# Patient Record
Sex: Female | Born: 1970 | Race: White | Hispanic: No | Marital: Married | Smoking: Never smoker
Health system: Southern US, Community
[De-identification: ages and names within clinical notes are randomized; demographics above are authoritative.]

## PROBLEM LIST (undated history)

## (undated) DIAGNOSIS — F329 Major depressive disorder, single episode, unspecified: Secondary | ICD-10-CM

## (undated) DIAGNOSIS — F909 Attention-deficit hyperactivity disorder, unspecified type: Secondary | ICD-10-CM

## (undated) DIAGNOSIS — F32A Depression, unspecified: Secondary | ICD-10-CM

## (undated) HISTORY — DX: Major depressive disorder, single episode, unspecified: F32.9

## (undated) HISTORY — DX: Depression, unspecified: F32.A

## (undated) HISTORY — PX: BREAST CYST ASPIRATION: SHX578

## (undated) HISTORY — DX: Attention-deficit hyperactivity disorder, unspecified type: F90.9

## (undated) HISTORY — PX: BREAST BIOPSY: SHX20

---

## 2000-12-29 ENCOUNTER — Other Ambulatory Visit: Admission: RE | Admit: 2000-12-29 | Discharge: 2000-12-29 | Payer: Self-pay | Admitting: Gynecology

## 2002-01-07 ENCOUNTER — Other Ambulatory Visit: Admission: RE | Admit: 2002-01-07 | Discharge: 2002-01-07 | Payer: Self-pay | Admitting: Gynecology

## 2002-06-13 ENCOUNTER — Other Ambulatory Visit: Admission: RE | Admit: 2002-06-13 | Discharge: 2002-06-13 | Payer: Self-pay | Admitting: Gynecology

## 2002-10-09 ENCOUNTER — Other Ambulatory Visit: Admission: RE | Admit: 2002-10-09 | Discharge: 2002-10-09 | Payer: Self-pay | Admitting: Gynecology

## 2003-04-02 ENCOUNTER — Other Ambulatory Visit: Admission: RE | Admit: 2003-04-02 | Discharge: 2003-04-02 | Payer: Self-pay | Admitting: Gynecology

## 2004-05-05 ENCOUNTER — Other Ambulatory Visit: Admission: RE | Admit: 2004-05-05 | Discharge: 2004-05-05 | Payer: Self-pay | Admitting: Gynecology

## 2005-01-30 ENCOUNTER — Inpatient Hospital Stay (HOSPITAL_COMMUNITY): Admission: AD | Admit: 2005-01-30 | Discharge: 2005-02-02 | Payer: Self-pay | Admitting: Obstetrics and Gynecology

## 2005-02-14 HISTORY — PX: THYROIDECTOMY: SHX17

## 2005-05-30 ENCOUNTER — Other Ambulatory Visit: Admission: RE | Admit: 2005-05-30 | Discharge: 2005-05-30 | Payer: Self-pay | Admitting: Gynecology

## 2006-08-02 ENCOUNTER — Other Ambulatory Visit: Admission: RE | Admit: 2006-08-02 | Discharge: 2006-08-02 | Payer: Self-pay | Admitting: Gynecology

## 2006-08-07 ENCOUNTER — Ambulatory Visit (HOSPITAL_COMMUNITY): Admission: RE | Admit: 2006-08-07 | Discharge: 2006-08-07 | Payer: Self-pay | Admitting: Cardiology

## 2006-12-06 ENCOUNTER — Encounter: Admission: RE | Admit: 2006-12-06 | Discharge: 2006-12-06 | Payer: Self-pay | Admitting: Surgery

## 2006-12-06 ENCOUNTER — Encounter (INDEPENDENT_AMBULATORY_CARE_PROVIDER_SITE_OTHER): Payer: Self-pay | Admitting: Diagnostic Radiology

## 2006-12-06 ENCOUNTER — Other Ambulatory Visit: Admission: RE | Admit: 2006-12-06 | Discharge: 2006-12-06 | Payer: Self-pay | Admitting: Diagnostic Radiology

## 2007-07-04 ENCOUNTER — Ambulatory Visit (HOSPITAL_COMMUNITY): Admission: RE | Admit: 2007-07-04 | Discharge: 2007-07-04 | Payer: Self-pay | Admitting: Internal Medicine

## 2007-07-04 ENCOUNTER — Encounter (INDEPENDENT_AMBULATORY_CARE_PROVIDER_SITE_OTHER): Payer: Self-pay | Admitting: Interventional Radiology

## 2007-07-20 ENCOUNTER — Encounter: Admission: RE | Admit: 2007-07-20 | Discharge: 2007-07-20 | Payer: Self-pay | Admitting: Gynecology

## 2007-12-14 ENCOUNTER — Other Ambulatory Visit: Admission: RE | Admit: 2007-12-14 | Discharge: 2007-12-14 | Payer: Self-pay | Admitting: Gynecology

## 2010-03-07 ENCOUNTER — Encounter: Payer: Self-pay | Admitting: Internal Medicine

## 2014-12-22 ENCOUNTER — Ambulatory Visit: Payer: Self-pay | Admitting: Adult Health

## 2015-02-03 ENCOUNTER — Ambulatory Visit: Payer: Self-pay | Admitting: Adult Health

## 2015-02-04 ENCOUNTER — Ambulatory Visit (INDEPENDENT_AMBULATORY_CARE_PROVIDER_SITE_OTHER): Payer: BLUE CROSS/BLUE SHIELD | Admitting: Adult Health

## 2015-02-04 ENCOUNTER — Encounter: Payer: Self-pay | Admitting: Adult Health

## 2015-02-04 VITALS — BP 100/70 | Temp 97.9°F | Ht 63.75 in | Wt 167.4 lb

## 2015-02-04 DIAGNOSIS — Z760 Encounter for issue of repeat prescription: Secondary | ICD-10-CM | POA: Diagnosis not present

## 2015-02-04 DIAGNOSIS — Z23 Encounter for immunization: Secondary | ICD-10-CM

## 2015-02-04 DIAGNOSIS — Z7189 Other specified counseling: Secondary | ICD-10-CM | POA: Diagnosis not present

## 2015-02-04 DIAGNOSIS — Z7689 Persons encountering health services in other specified circumstances: Secondary | ICD-10-CM

## 2015-02-04 MED ORDER — AMPHETAMINE-DEXTROAMPHETAMINE 10 MG PO TABS: 10.0000 mg | ORAL_TABLET | Freq: Every day | ORAL | Status: AC

## 2015-02-04 NOTE — Patient Instructions (Addendum)
It was great meeting you today!  Follow up with me in Jan/Feb for a complete physical.   If you need anything in the meantime, please let me know.   I hope you and your family have a great holiday season.   Health Maintenance, Female Adopting a healthy lifestyle and getting preventive care can go a long way to promote health and wellness. Talk with your health care provider about what schedule of regular examinations is right for you. This is a good chance for you to check in with your provider about disease prevention and staying healthy. In between checkups, there are plenty of things you can do on your own. Experts have done a lot of research about which lifestyle changes and preventive measures are most likely to keep you healthy. Ask your health care provider for more information. WEIGHT AND DIET  Eat a healthy diet  Be sure to include plenty of vegetables, fruits, low-fat dairy products, and lean protein.  Do not eat a lot of foods high in solid fats, added sugars, or salt.  Get regular exercise. This is one of the most important things you can do for your health.  Most adults should exercise for at least 150 minutes each week. The exercise should increase your heart rate and make you sweat (moderate-intensity exercise).  Most adults should also do strengthening exercises at least twice a week. This is in addition to the moderate-intensity exercise.  Maintain a healthy weight  Body mass index (BMI) is a measurement that can be used to identify possible weight problems. It estimates body fat based on height and weight. Your health care provider can help determine your BMI and help you achieve or maintain a healthy weight.  For females 59 years of age and older:   A BMI below 18.5 is considered underweight.  A BMI of 18.5 to 24.9 is normal.  A BMI of 25 to 29.9 is considered overweight.  A BMI of 30 and above is considered obese.  Watch levels of cholesterol and blood  lipids  You should start having your blood tested for lipids and cholesterol at 43 years of age, then have this test every 5 years.  You may need to have your cholesterol levels checked more often if:  Your lipid or cholesterol levels are high.  You are older than 43 years of age.  You are at high risk for heart disease.  CANCER SCREENING   Lung Cancer  Lung cancer screening is recommended for adults 75-31 years old who are at high risk for lung cancer because of a history of smoking.  A yearly low-dose CT scan of the lungs is recommended for people who:  Currently smoke.  Have quit within the past 15 years.  Have at least a 30-pack-year history of smoking. A pack year is smoking an average of one pack of cigarettes a day for 1 year.  Yearly screening should continue until it has been 15 years since you quit.  Yearly screening should stop if you develop a health problem that would prevent you from having lung cancer treatment.  Breast Cancer  Practice breast self-awareness. This means understanding how your breasts normally appear and feel.  It also means doing regular breast self-exams. Let your health care provider know about any changes, no matter how small.  If you are in your 20s or 30s, you should have a clinical breast exam (CBE) by a health care provider every 1-3 years as part of a regular  health exam.  If you are 40 or older, have a CBE every year. Also consider having a breast X-ray (mammogram) every year.  If you have a family history of breast cancer, talk to your health care provider about genetic screening.  If you are at high risk for breast cancer, talk to your health care provider about having an MRI and a mammogram every year.  Breast cancer gene (BRCA) assessment is recommended for women who have family members with BRCA-related cancers. BRCA-related cancers include:  Breast.  Ovarian.  Tubal.  Peritoneal cancers.  Results of the assessment  will determine the need for genetic counseling and BRCA1 and BRCA2 testing. Cervical Cancer Your health care provider may recommend that you be screened regularly for cancer of the pelvic organs (ovaries, uterus, and vagina). This screening involves a pelvic examination, including checking for microscopic changes to the surface of your cervix (Pap test). You may be encouraged to have this screening done every 3 years, beginning at age 13.  For women ages 66-65, health care providers may recommend pelvic exams and Pap testing every 3 years, or they may recommend the Pap and pelvic exam, combined with testing for human papilloma virus (HPV), every 5 years. Some types of HPV increase your risk of cervical cancer. Testing for HPV may also be done on women of any age with unclear Pap test results.  Other health care providers may not recommend any screening for nonpregnant women who are considered low risk for pelvic cancer and who do not have symptoms. Ask your health care provider if a screening pelvic exam is right for you.  If you have had past treatment for cervical cancer or a condition that could lead to cancer, you need Pap tests and screening for cancer for at least 20 years after your treatment. If Pap tests have been discontinued, your risk factors (such as having a new sexual partner) need to be reassessed to determine if screening should resume. Some women have medical problems that increase the chance of getting cervical cancer. In these cases, your health care provider may recommend more frequent screening and Pap tests. Colorectal Cancer  This type of cancer can be detected and often prevented.  Routine colorectal cancer screening usually begins at 43 years of age and continues through 43 years of age.  Your health care provider may recommend screening at an earlier age if you have risk factors for colon cancer.  Your health care provider may also recommend using home test kits to check  for hidden blood in the stool.  A small camera at the end of a tube can be used to examine your colon directly (sigmoidoscopy or colonoscopy). This is done to check for the earliest forms of colorectal cancer.  Routine screening usually begins at age 72.  Direct examination of the colon should be repeated every 5-10 years through 43 years of age. However, you may need to be screened more often if early forms of precancerous polyps or small growths are found. Skin Cancer  Check your skin from head to toe regularly.  Tell your health care provider about any new moles or changes in moles, especially if there is a change in a mole's shape or color.  Also tell your health care provider if you have a mole that is larger than the size of a pencil eraser.  Always use sunscreen. Apply sunscreen liberally and repeatedly throughout the day.  Protect yourself by wearing long sleeves, pants, a wide-brimmed hat, and  sunglasses whenever you are outside. HEART DISEASE, DIABETES, AND HIGH BLOOD PRESSURE   High blood pressure causes heart disease and increases the risk of stroke. High blood pressure is more likely to develop in:  People who have blood pressure in the high end of the normal range (130-139/85-89 mm Hg).  People who are overweight or obese.  People who are African American.  If you are 13-40 years of age, have your blood pressure checked every 3-5 years. If you are 65 years of age or older, have your blood pressure checked every year. You should have your blood pressure measured twice--once when you are at a hospital or clinic, and once when you are not at a hospital or clinic. Record the average of the two measurements. To check your blood pressure when you are not at a hospital or clinic, you can use:  An automated blood pressure machine at a pharmacy.  A home blood pressure monitor.  If you are between 55 years and 85 years old, ask your health care provider if you should take  aspirin to prevent strokes.  Have regular diabetes screenings. This involves taking a blood sample to check your fasting blood sugar level.  If you are at a normal weight and have a low risk for diabetes, have this test once every three years after 43 years of age.  If you are overweight and have a high risk for diabetes, consider being tested at a younger age or more often. PREVENTING INFECTION  Hepatitis B  If you have a higher risk for hepatitis B, you should be screened for this virus. You are considered at high risk for hepatitis B if:  You were born in a country where hepatitis B is common. Ask your health care provider which countries are considered high risk.  Your parents were born in a high-risk country, and you have not been immunized against hepatitis B (hepatitis B vaccine).  You have HIV or AIDS.  You use needles to inject street drugs.  You live with someone who has hepatitis B.  You have had sex with someone who has hepatitis B.  You get hemodialysis treatment.  You take certain medicines for conditions, including cancer, organ transplantation, and autoimmune conditions. Hepatitis C  Blood testing is recommended for:  Everyone born from [birth year] through 1964.  Anyone with known risk factors for hepatitis C. Sexually transmitted infections (STIs)  You should be screened for sexually transmitted infections (STIs) including gonorrhea and chlamydia if:  You are sexually active and are younger than 44 years of age.  You are older than 43 years of age and your health care provider tells you that you are at risk for this type of infection.  Your sexual activity has changed since you were last screened and you are at an increased risk for chlamydia or gonorrhea. Ask your health care provider if you are at risk.  If you do not have HIV, but are at risk, it may be recommended that you take a prescription medicine daily to prevent HIV infection. This is called  pre-exposure prophylaxis (PrEP). You are considered at risk if:  You are sexually active and do not regularly use condoms or know the HIV status of your partner(s).  You take drugs by injection.  You are sexually active with a partner who has HIV. Talk with your health care provider about whether you are at high risk of being infected with HIV. If you choose to begin PrEP, you should first  be tested for HIV. You should then be tested every 3 months for as long as you are taking PrEP.  PREGNANCY   If you are premenopausal and you may become pregnant, ask your health care provider about preconception counseling.  If you may become pregnant, take 400 to 800 micrograms (mcg) of folic acid every day.  If you want to prevent pregnancy, talk to your health care provider about birth control (contraception). OSTEOPOROSIS AND MENOPAUSE   Osteoporosis is a disease in which the bones lose minerals and strength with aging. This can result in serious bone fractures. Your risk for osteoporosis can be identified using a bone density scan.  If you are 47 years of age or older, or if you are at risk for osteoporosis and fractures, ask your health care provider if you should be screened.  Ask your health care provider whether you should take a calcium or vitamin D supplement to lower your risk for osteoporosis.  Menopause may have certain physical symptoms and risks.  Hormone replacement therapy may reduce some of these symptoms and risks. Talk to your health care provider about whether hormone replacement therapy is right for you.  HOME CARE INSTRUCTIONS   Schedule regular health, dental, and eye exams.  Stay current with your immunizations.   Do not use any tobacco products including cigarettes, chewing tobacco, or electronic cigarettes.  If you are pregnant, do not drink alcohol.  If you are breastfeeding, limit how much and how often you drink alcohol.  Limit alcohol intake to no more than 1  drink per day for nonpregnant women. One drink equals 12 ounces of beer, 5 ounces of wine, or 1 ounces of hard liquor.  Do not use street drugs.  Do not share needles.  Ask your health care provider for help if you need support or information about quitting drugs.  Tell your health care provider if you often feel depressed.  Tell your health care provider if you have ever been abused or do not feel safe at home.   This information is not intended to replace advice given to you by your health care provider. Make sure you discuss any questions you have with your health care provider.   Document Released: 08/16/2010 Document Revised: 02/21/2014 Document Reviewed: 01/02/2013 Elsevier Interactive Patient Education [employer].

## 2015-02-04 NOTE — Progress Notes (Deleted)
   Subjective:    Patient ID: Whitney Gordon, female    DOB: 1970-11-14, 44 y.o.   MRN: [id number]  HPI    Review of Systems     Objective:   Physical Exam        Assessment & Plan:

## 2015-02-04 NOTE — Progress Notes (Signed)
HPI:  Whitney Gordon is here to establish care. She is a pleasant Caucasian female who  has a past medical history of ADHD (attention deficit hyperactivity disorder) and Depression.  Last PCP and physical: five years ago    Immunizations: Does not want flu  Diet:eats healthy Exercise:exercises  Pap Smear: 2014 Mammogram:  2014  Has the following chronic problems that require follow up and concerns today:  ADHD - She does not take Adderall every day and when she does she usually only takes 5 mg. She has been out of her medication for some time now, and would like to have a prescription for Adderall.   ROS negative for unless reported above: fevers, chills,feeling poorly, unintentional weight loss, hearing or vision loss, chest pain, palpitations, leg claudication, struggling to breath,Not feeling congested in the chest, no orthopenia, no cough,no wheezing, normal appetite, no soft tissue swelling, no hemoptysis, melena, hematochezia, hematuria, falls, loc, si, or thoughts of self harm.   Past Medical History  Diagnosis Date  . ADHD (attention deficit hyperactivity disorder)   . Depression     Past Surgical History  Procedure Laterality Date  . Thyroidectomy  2007    Family History  Problem Relation Age of Onset  . Breast cancer Paternal Grandmother   . Ovarian cancer Paternal Grandmother   . Heart disease Father   . Sudden death Father 62  . Hypertension Mother   . Diabetes type II Mother     Social History   Social History  . Marital Status: Married    Spouse Name: N/A  . Number of Children: N/A  . Years of Education: N/A   Social History Main Topics  . Smoking status: Never Smoker   . Smokeless tobacco: None  . Alcohol Use: 0.0 oz/week    0 Standard drinks or equivalent per week     Comment: 1 glass of wine 2 to 3 times a week with dinner  . Drug Use: No  . Sexual Activity: Not Asked   Other Topics Concern  . None   Social History Narrative    Works with [employer]. Is a Public relations account executive.    Married    Daughter who is ten. Has two adopted children    Masters in HR development.    She likes to cook and travel.      Current outpatient prescriptions:  .  amphetamine-dextroamphetamine (ADDERALL) 10 MG tablet, Take 1 tablet (10 mg total) by mouth daily with breakfast., Disp: 30 tablet, Rfl: 0  EXAM:  Filed Vitals:   02/04/15 1506  BP: 100/70  Temp: 97.9 F (36.6 C)    Body mass index is 28.97 kg/(m^2).  GENERAL: vitals reviewed and listed above, alert, oriented, appears well hydrated and in no acute distress  HEENT: atraumatic, conjunttiva clear, no obvious abnormalities on inspection of external nose and ears  NECK: Neck is soft and supple without masses, no adenopathy or thyromegaly, trachea midline, no JVD. Normal range of motion.   LUNGS: clear to auscultation bilaterally, no wheezes, rales or rhonchi, good air movement  CV: Regular rate and rhythm, normal S1/S2, no audible murmurs, gallops, or rubs. No carotid bruit and no peripheral edema.   MS: moves all extremities without noticeable abnormality. No edema noted  Abd: soft/nontender/nondistended/normal bowel sounds   Skin: warm and dry, no rash   Extremities: No clubbing, cyanosis, or edema. Capillary refill is WNL. Pulses intact bilaterally in upper and lower extremities.   Neuro: CN II-XII intact, sensation  and reflexes normal throughout, 5/5 muscle strength in bilateral upper and lower extremities. Normal finger to nose. Normal rapid alternating movements. Normal romberg. No pronator drift.   PSYCH: pleasant and cooperative, no obvious depression or anxiety  ASSESSMENT AND PLAN:  1. Encounter to establish care - Follow up in Jan/Feb for CPE - Follow up sooner if needed - Continue to work with a trainer and eat healthy  2. Medication refill  - amphetamine-dextroamphetamine (ADDERALL) 10 MG tablet; Take 1 tablet (10 mg total) by mouth daily  with breakfast.  Dispense: 30 tablet; Refill: 0   -We reviewed the PMH, PSH, FH, SH, Meds and Allergies. -We provided refills for any medications we will prescribe as needed. -We addressed current concerns per orders and patient instructions. -We have asked for records for pertinent exams, studies, vaccines and notes from previous providers. -We have advised patient to follow up per instructions below.   -Patient advised to return or notify a provider immediately if symptoms worsen or persist or new concerns arise.   AMR CorporationCory Chesney Suares

## 2015-02-06 DIAGNOSIS — Z23 Encounter for immunization: Secondary | ICD-10-CM | POA: Diagnosis not present

## 2015-02-06 NOTE — Addendum Note (Signed)
Addended by: Azucena FreedMILLNER, Lexii Walsh C on: 02/06/2015 10:27 AM   Modules accepted: Orders

## 2015-03-18 ENCOUNTER — Ambulatory Visit: Payer: Self-pay | Admitting: Adult Health

## 2015-03-20 ENCOUNTER — Other Ambulatory Visit (INDEPENDENT_AMBULATORY_CARE_PROVIDER_SITE_OTHER): Payer: BLUE CROSS/BLUE SHIELD

## 2015-03-20 DIAGNOSIS — Z0000 Encounter for general adult medical examination without abnormal findings: Secondary | ICD-10-CM

## 2015-03-20 LAB — CBC WITH DIFFERENTIAL/PLATELET
BASOS PCT: 0.6 % (ref 0.0–3.0)
Basophils Absolute: 0 10*3/uL (ref 0.0–0.1)
EOS PCT: 2.3 % (ref 0.0–5.0)
Eosinophils Absolute: 0.1 10*3/uL (ref 0.0–0.7)
HCT: 35.5 % — ABNORMAL LOW (ref 36.0–46.0)
HEMOGLOBIN: 11.4 g/dL — AB (ref 12.0–15.0)
Lymphocytes Relative: 38.7 % (ref 12.0–46.0)
Lymphs Abs: 1.9 10*3/uL (ref 0.7–4.0)
MCHC: 32.2 g/dL (ref 30.0–36.0)
MCV: 80.2 fl (ref 78.0–100.0)
MONO ABS: 0.4 10*3/uL (ref 0.1–1.0)
MONOS PCT: 7.9 % (ref 3.0–12.0)
Neutro Abs: 2.5 10*3/uL (ref 1.4–7.7)
Neutrophils Relative %: 50.5 % (ref 43.0–77.0)
Platelets: 251 10*3/uL (ref 150.0–400.0)
RBC: 4.43 Mil/uL (ref 3.87–5.11)
RDW: 16.6 % — AB (ref 11.5–15.5)
WBC: 5 10*3/uL (ref 4.0–10.5)

## 2015-03-20 LAB — LIPID PANEL
CHOL/HDL RATIO: 2
Cholesterol: 169 mg/dL (ref 0–200)
HDL: 76.5 mg/dL (ref >39.00)
LDL Cholesterol: 79 mg/dL (ref 0–99)
NONHDL: 92.25
Triglycerides: 65 mg/dL (ref 0.0–149.0)
VLDL: 13 mg/dL (ref 0.0–40.0)

## 2015-03-20 LAB — BASIC METABOLIC PANEL
BUN: 9 mg/dL (ref 6–23)
CHLORIDE: 104 meq/L (ref 96–112)
CO2: 27 mEq/L (ref 19–32)
Calcium: 9.3 mg/dL (ref 8.4–10.5)
Creatinine, Ser: 0.79 mg/dL (ref 0.40–1.20)
GFR: 84.35 mL/min (ref >60.00)
GLUCOSE: 100 mg/dL — AB (ref 70–99)
POTASSIUM: 3.8 meq/L (ref 3.5–5.1)
SODIUM: 138 meq/L (ref 135–145)

## 2015-03-20 LAB — HEPATIC FUNCTION PANEL
ALK PHOS: 53 U/L (ref 39–117)
ALT: 16 U/L (ref 0–35)
AST: 20 U/L (ref 0–37)
Albumin: 4.1 g/dL (ref 3.5–5.2)
BILIRUBIN DIRECT: 0.1 mg/dL (ref 0.0–0.3)
Total Bilirubin: 0.5 mg/dL (ref 0.2–1.2)
Total Protein: 7 g/dL (ref 6.0–8.3)

## 2015-03-20 LAB — POC URINALSYSI DIPSTICK (AUTOMATED)
BILIRUBIN UA: NEGATIVE
GLUCOSE UA: NEGATIVE
Ketones, UA: NEGATIVE
Nitrite, UA: NEGATIVE
Protein, UA: NEGATIVE
SPEC GRAV UA: 1.015
Urobilinogen, UA: 0.2
pH, UA: 7.5

## 2015-03-20 LAB — TSH: TSH: 2.03 u[IU]/mL (ref 0.35–4.50)

## 2015-03-27 ENCOUNTER — Ambulatory Visit (INDEPENDENT_AMBULATORY_CARE_PROVIDER_SITE_OTHER): Payer: BLUE CROSS/BLUE SHIELD | Admitting: Adult Health

## 2015-03-27 ENCOUNTER — Encounter: Payer: Self-pay | Admitting: Adult Health

## 2015-03-27 DIAGNOSIS — Z0000 Encounter for general adult medical examination without abnormal findings: Secondary | ICD-10-CM

## 2015-03-27 DIAGNOSIS — F909 Attention-deficit hyperactivity disorder, unspecified type: Secondary | ICD-10-CM | POA: Insufficient documentation

## 2015-03-27 NOTE — Patient Instructions (Addendum)
Please come back more often!   Your exam is completely benign. Continue to work on diet and exercise, you are doing a great job.   Follow up with me in one month and we can discuss medications if needed.   The two medications I prescribe are   Phentermine ( not phen-phen) and Belviq  Please let me know if you need anything.   Health Maintenance, Female Adopting a healthy lifestyle and getting preventive care can go a long way to promote health and wellness. Talk with your health care provider about what schedule of regular examinations is right for you. This is a good chance for you to check in with your provider about disease prevention and staying healthy. In between checkups, there are plenty of things you can do on your own. Experts have done a lot of research about which lifestyle changes and preventive measures are most likely to keep you healthy. Ask your health care provider for more information. WEIGHT AND DIET  Eat a healthy diet  Be sure to include plenty of vegetables, fruits, low-fat dairy products, and lean protein.  Do not eat a lot of foods high in solid fats, added sugars, or salt.  Get regular exercise. This is one of the most important things you can do for your health.  Most adults should exercise for at least 150 minutes each week. The exercise should increase your heart rate and make you sweat (moderate-intensity exercise).  Most adults should also do strengthening exercises at least twice a week. This is in addition to the moderate-intensity exercise.  Maintain a healthy weight  Body mass index (BMI) is a measurement that can be used to identify possible weight problems. It estimates body fat based on height and weight. Your health care provider can help determine your BMI and help you achieve or maintain a healthy weight.  For females 10 years of age and older:   A BMI below 18.5 is considered underweight.  A BMI of 18.5 to 24.9 is normal.  A BMI of 25  to 29.9 is considered overweight.  A BMI of 30 and above is considered obese.  Watch levels of cholesterol and blood lipids  You should start having your blood tested for lipids and cholesterol at 44 years of age, then have this test every 5 years.  You may need to have your cholesterol levels checked more often if:  Your lipid or cholesterol levels are high.  You are older than 44 years of age.  You are at high risk for heart disease.  CANCER SCREENING   Lung Cancer  Lung cancer screening is recommended for adults 78-37 years old who are at high risk for lung cancer because of a history of smoking.  A yearly low-dose CT scan of the lungs is recommended for people who:  Currently smoke.  Have quit within the past 15 years.  Have at least a 30-pack-year history of smoking. A pack year is smoking an average of one pack of cigarettes a day for 1 year.  Yearly screening should continue until it has been 15 years since you quit.  Yearly screening should stop if you develop a health problem that would prevent you from having lung cancer treatment.  Breast Cancer  Practice breast self-awareness. This means understanding how your breasts normally appear and feel.  It also means doing regular breast self-exams. Let your health care provider know about any changes, no matter how small.  If you are in your 3s  or 60s, you should have a clinical breast exam (CBE) by a health care provider every 1-3 years as part of a regular health exam.  If you are 15 or older, have a CBE every year. Also consider having a breast X-ray (mammogram) every year.  If you have a family history of breast cancer, talk to your health care provider about genetic screening.  If you are at high risk for breast cancer, talk to your health care provider about having an MRI and a mammogram every year.  Breast cancer gene (BRCA) assessment is recommended for women who have family members with BRCA-related  cancers. BRCA-related cancers include:  Breast.  Ovarian.  Tubal.  Peritoneal cancers.  Results of the assessment will determine the need for genetic counseling and BRCA1 and BRCA2 testing. Cervical Cancer Your health care provider may recommend that you be screened regularly for cancer of the pelvic organs (ovaries, uterus, and vagina). This screening involves a pelvic examination, including checking for microscopic changes to the surface of your cervix (Pap test). You may be encouraged to have this screening done every 3 years, beginning at age 93.  For women ages 71-65, health care providers may recommend pelvic exams and Pap testing every 3 years, or they may recommend the Pap and pelvic exam, combined with testing for human papilloma virus (HPV), every 5 years. Some types of HPV increase your risk of cervical cancer. Testing for HPV may also be done on women of any age with unclear Pap test results.  Other health care providers may not recommend any screening for nonpregnant women who are considered low risk for pelvic cancer and who do not have symptoms. Ask your health care provider if a screening pelvic exam is right for you.  If you have had past treatment for cervical cancer or a condition that could lead to cancer, you need Pap tests and screening for cancer for at least 20 years after your treatment. If Pap tests have been discontinued, your risk factors (such as having a new sexual partner) need to be reassessed to determine if screening should resume. Some women have medical problems that increase the chance of getting cervical cancer. In these cases, your health care provider may recommend more frequent screening and Pap tests. Colorectal Cancer  This type of cancer can be detected and often prevented.  Routine colorectal cancer screening usually begins at 44 years of age and continues through 44 years of age.  Your health care provider may recommend screening at an earlier  age if you have risk factors for colon cancer.  Your health care provider may also recommend using home test kits to check for hidden blood in the stool.  A small camera at the end of a tube can be used to examine your colon directly (sigmoidoscopy or colonoscopy). This is done to check for the earliest forms of colorectal cancer.  Routine screening usually begins at age 65.  Direct examination of the colon should be repeated every 5-10 years through 44 years of age. However, you may need to be screened more often if early forms of precancerous polyps or small growths are found. Skin Cancer  Check your skin from head to toe regularly.  Tell your health care provider about any new moles or changes in moles, especially if there is a change in a mole's shape or color.  Also tell your health care provider if you have a mole that is larger than the size of a pencil eraser.  Always use sunscreen. Apply sunscreen liberally and repeatedly throughout the day.  Protect yourself by wearing long sleeves, pants, a wide-brimmed hat, and sunglasses whenever you are outside. HEART DISEASE, DIABETES, AND HIGH BLOOD PRESSURE   High blood pressure causes heart disease and increases the risk of stroke. High blood pressure is more likely to develop in:  People who have blood pressure in the high end of the normal range (130-139/85-89 mm Hg).  People who are overweight or obese.  People who are African American.  If you are 83-72 years of age, have your blood pressure checked every 3-5 years. If you are 104 years of age or older, have your blood pressure checked every year. You should have your blood pressure measured twice--once when you are at a hospital or clinic, and once when you are not at a hospital or clinic. Record the average of the two measurements. To check your blood pressure when you are not at a hospital or clinic, you can use:  An automated blood pressure machine at a pharmacy.  A home  blood pressure monitor.  If you are between 77 years and 86 years old, ask your health care provider if you should take aspirin to prevent strokes.  Have regular diabetes screenings. This involves taking a blood sample to check your fasting blood sugar level.  If you are at a normal weight and have a low risk for diabetes, have this test once every three years after 44 years of age.  If you are overweight and have a high risk for diabetes, consider being tested at a younger age or more often. PREVENTING INFECTION  Hepatitis B  If you have a higher risk for hepatitis B, you should be screened for this virus. You are considered at high risk for hepatitis B if:  You were born in a country where hepatitis B is common. Ask your health care provider which countries are considered high risk.  Your parents were born in a high-risk country, and you have not been immunized against hepatitis B (hepatitis B vaccine).  You have HIV or AIDS.  You use needles to inject street drugs.  You live with someone who has hepatitis B.  You have had sex with someone who has hepatitis B.  You get hemodialysis treatment.  You take certain medicines for conditions, including cancer, organ transplantation, and autoimmune conditions. Hepatitis C  Blood testing is recommended for:  Everyone born from [birth year] through 1964.  Anyone with known risk factors for hepatitis C. Sexually transmitted infections (STIs)  You should be screened for sexually transmitted infections (STIs) including gonorrhea and chlamydia if:  You are sexually active and are younger than 44 years of age.  You are older than 44 years of age and your health care provider tells you that you are at risk for this type of infection.  Your sexual activity has changed since you were last screened and you are at an increased risk for chlamydia or gonorrhea. Ask your health care provider if you are at risk.  If you do not have HIV, but are at  risk, it may be recommended that you take a prescription medicine daily to prevent HIV infection. This is called pre-exposure prophylaxis (PrEP). You are considered at risk if:  You are sexually active and do not regularly use condoms or know the HIV status of your partner(s).  You take drugs by injection.  You are sexually active with a partner who has HIV. Talk with your health  care provider about whether you are at high risk of being infected with HIV. If you choose to begin PrEP, you should first be tested for HIV. You should then be tested every 3 months for as long as you are taking PrEP.  PREGNANCY   If you are premenopausal and you may become pregnant, ask your health care provider about preconception counseling.  If you may become pregnant, take 400 to 800 micrograms (mcg) of folic acid every day.  If you want to prevent pregnancy, talk to your health care provider about birth control (contraception). OSTEOPOROSIS AND MENOPAUSE   Osteoporosis is a disease in which the bones lose minerals and strength with aging. This can result in serious bone fractures. Your risk for osteoporosis can be identified using a bone density scan.  If you are 46 years of age or older, or if you are at risk for osteoporosis and fractures, ask your health care provider if you should be screened.  Ask your health care provider whether you should take a calcium or vitamin D supplement to lower your risk for osteoporosis.  Menopause may have certain physical symptoms and risks.  Hormone replacement therapy may reduce some of these symptoms and risks. Talk to your health care provider about whether hormone replacement therapy is right for you.  HOME CARE INSTRUCTIONS   Schedule regular health, dental, and eye exams.  Stay current with your immunizations.   Do not use any tobacco products including cigarettes, chewing tobacco, or electronic cigarettes.  If you are pregnant, do not drink alcohol.  If  you are breastfeeding, limit how much and how often you drink alcohol.  Limit alcohol intake to no more than 1 drink per day for nonpregnant women. One drink equals 12 ounces of beer, 5 ounces of wine, or 1 ounces of hard liquor.  Do not use street drugs.  Do not share needles.  Ask your health care provider for help if you need support or information about quitting drugs.  Tell your health care provider if you often feel depressed.  Tell your health care provider if you have ever been abused or do not feel safe at home.   This information is not intended to replace advice given to you by your health care provider. Make sure you discuss any questions you have with your health care provider.   Document Released: 08/16/2010 Document Revised: 02/21/2014 Document Reviewed: 01/02/2013 Elsevier Interactive Patient Education [employer].

## 2015-03-27 NOTE — Progress Notes (Signed)
Subjective:    Patient ID: Whitney Gordon, female    DOB: [birth year], 45 y.o.   MRN: [id number]  HPI  Patient presents for yearly preventative medicine. She is a pleasant 45 year old female who  has a past medical history of ADHD (attention deficit hyperactivity disorder) and Depression.. She is very active and has been eating very healthy and exercising multiple times a week with a trainer, unfortunately she has not lost any weight.   All immunizations and health maintenance protocols were reviewed with the patient and needed orders were placed.  Medication reconciliation,  past medical history, social history, problem list and allergies were reviewed in detail with the patient  Goals were established with regard to weight loss, exercise, and  diet in compliance with medications  End of life planning was discussed and she has an advanced directive and living will.   Review of Systems  Constitutional: Negative.   HENT: Negative.   Eyes: Negative.   Respiratory: Negative.   Cardiovascular: Negative.   Gastrointestinal: Negative.   Endocrine: Negative.   Genitourinary: Negative.   Musculoskeletal: Negative.   Allergic/Immunologic: Negative.   Neurological: Negative.   Hematological: Negative.   Psychiatric/Behavioral: Negative.    Past Medical History  Diagnosis Date  . ADHD (attention deficit hyperactivity disorder)   . Depression     Social History   Social History  . Marital Status: Married    Spouse Name: N/A  . Number of Children: N/A  . Years of Education: N/A   Occupational History  . Not on file.   Social History Main Topics  . Smoking status: Never Smoker   . Smokeless tobacco: Not on file  . Alcohol Use: 0.0 oz/week    0 Standard drinks or equivalent per week     Comment: 1 glass of wine 2 to 3 times a week with dinner  . Drug Use: No  . Sexual Activity: Not on file   Other Topics Concern  . 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    Married    Daughter who is ten. Has two adopted children    Masters in HR development.    She likes to cook and travel.     Past Surgical History  Procedure Laterality Date  . Thyroidectomy  2007    Family History  Problem Relation Age of Onset  . Breast cancer Paternal Grandmother   . Ovarian cancer Paternal Grandmother   . Heart disease Father   . Sudden death Father 33  . Hypertension Mother   . Diabetes type II Mother     Allergies  Allergen Reactions  . Tetracycline     Other reaction(s): Other (See Comments) Other Reaction: Throat burning, upset stomach    Current Outpatient Prescriptions on File Prior to Visit  Medication Sig Dispense Refill  . amphetamine-dextroamphetamine (ADDERALL) 10 MG tablet Take 1 tablet (10 mg total) by mouth daily with breakfast. 30 tablet 0   No current facility-administered medications on file prior to visit.    BP 100/60 mmHg  Pulse 84  Temp(Src) 97.3 F (36.3 C) (Oral)  Ht 5' 4.17" (1.63 m)  Wt 167 lb 12.8 oz (76.114 kg)  BMI 28.65 kg/m2  SpO2 99%       Objective:   Physical Exam  Constitutional: She is oriented to person, place, and time. She appears well-developed and well-nourished. No distress.  HENT:  Head: Normocephalic and atraumatic.  Right Ear: External ear normal.  Left Ear: External ear normal.  Nose: Nose normal.  Mouth/Throat: Oropharynx is clear and moist. No oropharyngeal exudate.  Eyes: Conjunctivae and EOM are normal. Pupils are equal, round, and reactive to light. Right eye exhibits no discharge. Left eye exhibits no discharge. No scleral icterus.  Neck: Normal range of motion. Neck supple. No JVD present. No tracheal deviation present. No thyromegaly present.  Cardiovascular: Normal rate, regular rhythm, normal heart sounds and intact distal pulses.  Exam reveals no gallop and no friction rub.   No murmur heard. Pulmonary/Chest: Effort normal and  breath sounds normal. No stridor. No respiratory distress. She has no wheezes. She has no rales. She exhibits no tenderness.  Abdominal: Soft. Bowel sounds are normal. She exhibits no distension. There is no tenderness. There is no rebound and no guarding.  Genitourinary:  Breast Exam: Fibrotic breast tissue. No masses felt.   GYN: She refused, has an appointment with GYN to discuss IUD.   Musculoskeletal: Normal range of motion. She exhibits no edema or tenderness.  Lymphadenopathy:    She has no cervical adenopathy.  Neurological: She is alert and oriented to person, place, and time. She displays normal reflexes. No cranial nerve deficit. She exhibits normal muscle tone. Coordination normal.  Skin: Skin is warm and dry. No rash noted. She is not diaphoretic. No erythema. No pallor.  Psychiatric: She has a normal mood and affect. Her behavior is normal. Judgment and thought content normal.  Nursing note and vitals reviewed.      Assessment & Plan:   1. Routine general medical examination at a health care facility - Reviewed labs in detail.  - Continue to eat healthy and exercise. Information given on phentermine and Belviq. She is going to follow up in one month if she feels as though she is not making and progress on her weight loss.  - Consider hormone testing.  - Follow up in one year for next CPE - Follow up sooner if needed for any acute issue

## 2015-03-27 NOTE — Progress Notes (Signed)
Pre visit review using our clinic review tool, if applicable. No additional management support is needed unless otherwise documented below in the visit note. 

## 2016-04-05 ENCOUNTER — Telehealth: Payer: Self-pay | Admitting: Adult Health

## 2016-04-05 ENCOUNTER — Other Ambulatory Visit: Payer: Self-pay

## 2016-04-05 MED ORDER — OSELTAMIVIR PHOSPHATE 75 MG PO CAPS: 75.0000 mg | ORAL_CAPSULE | Freq: Two times a day (BID) | ORAL | 0 refills | Status: DC

## 2016-04-05 NOTE — Telephone Encounter (Signed)
Ok for tamiflu 75mg BID x 5 days 

## 2016-04-05 NOTE — Telephone Encounter (Signed)
Pt's 46 yr old daughter dx with the flu. Daughter pediatrician advised pt to start  tami flu  While she is not having any symptoms.  Would like for you to call in this rx please.  [facility] [address]

## 2016-04-05 NOTE — Telephone Encounter (Signed)
Rx has been sent in. 

## 2016-04-05 NOTE — Telephone Encounter (Signed)
See below and advise

## 2016-05-30 ENCOUNTER — Encounter: Payer: Self-pay | Admitting: Family Medicine

## 2016-05-30 ENCOUNTER — Ambulatory Visit (INDEPENDENT_AMBULATORY_CARE_PROVIDER_SITE_OTHER): Payer: BLUE CROSS/BLUE SHIELD | Admitting: Family Medicine

## 2016-05-30 VITALS — BP 102/80 | HR 70 | Temp 97.8°F | Wt 165.6 lb

## 2016-05-30 DIAGNOSIS — B029 Zoster without complications: Secondary | ICD-10-CM | POA: Diagnosis not present

## 2016-05-30 MED ORDER — FAMCICLOVIR 500 MG PO TABS: 500.0000 mg | ORAL_TABLET | Freq: Three times a day (TID) | ORAL | 0 refills | Status: DC

## 2016-05-30 NOTE — Progress Notes (Signed)
Subjective:    Patient ID: Whitney Gordon, female    DOB: [birth year], 46 y.o.   MRN: [id number]  HPI  Ms. Whitney Gordon is a 46 year old who presents today with burning and itching on her back at her bra line that started almost 48 hours ago. Associated pain when she scratches however no lesions have been noted. She denies fever, chills, sweats, N/V/D, myalgias. History of chicken pox as a child.  No additional stress has been reported.  No other rash is noted.  Initial distribution: Symptoms noted at Bra line Discomfort associated. Pain with scratching Associated symptoms: Pruritus.  Denies: fever, chills sweats, myalgias, difficulty swallowing, hoarseness, SOB, N/V, abdominal pain, or tightening of throat.  No new exposures of soaps, lotions, laundry detergent, fabric softeners, foods, medications, plants, animals, or insects.  No treatments have been tried at this time.. No aggravating or alleviating factors noted  Review of Systems  Constitutional: Negative for chills, fatigue and fever.  Respiratory: Negative for cough, shortness of breath and wheezing.   Cardiovascular: Negative for chest pain and palpitations.  Gastrointestinal: Negative for abdominal pain, diarrhea, nausea and vomiting.  Skin: Negative for rash.       Itching and burning with scratching at bra line  Neurological: Negative for dizziness, light-headedness, numbness and headaches.  Psychiatric/Behavioral:       Denies depressed or anxious mood   Past Medical History:  Diagnosis Date  . ADHD (attention deficit hyperactivity disorder)   . Depression      Social History   Social History  . Marital status: Married    Spouse name: N/A  . Number of children: N/A  . Years of education: N/A   Occupational History  . Not on file.   Social History Main Topics  . Smoking status: Never Smoker  . Smokeless tobacco: Never Used  . Alcohol use 0.0 oz/week     Comment: 1 glass of wine 2 to 3 times a week  with dinner  . Drug use: No  . Sexual activity: Not on file   Other Topics Concern  . 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    Married    Daughter who is ten. Has two adopted children    Masters in HR development.    She likes to cook and travel.     Past Surgical History:  Procedure Laterality Date  . THYROIDECTOMY  2007    Family History  Problem Relation Age of Onset  . Breast cancer Paternal Grandmother   . Ovarian cancer Paternal Grandmother   . Heart disease Father   . Sudden death Father 34  . Hypertension Mother   . Diabetes type II Mother     Allergies  Allergen Reactions  . Tetracycline     Other reaction(s): Other (See Comments) Other Reaction: Throat burning, upset stomach    Current Outpatient Prescriptions on File Prior to Visit  Medication Sig Dispense Refill  . amphetamine-dextroamphetamine (ADDERALL) 10 MG tablet Take 1 tablet (10 mg total) by mouth daily with breakfast. 30 tablet 0   No current facility-administered medications on file prior to visit.     BP 102/80 (BP Location: Right Arm, Patient Position: Standing, Cuff Size: Normal)   Pulse 70   Temp 97.8 F (36.6 C) (Oral)   Wt 165 lb 9.6 oz (75.1 kg)   SpO2 98%   BMI 28.27 kg/m       Objective:  Physical Exam  Constitutional: She is oriented to person, place, and time. She appears well-developed and well-nourished.  Eyes: Pupils are equal, round, and reactive to light. No scleral icterus.  Neck: Neck supple.  Cardiovascular: Normal rate and regular rhythm.   Pulmonary/Chest: Effort normal and breath sounds normal.  Abdominal: Soft. Bowel sounds are normal. There is no tenderness.  Musculoskeletal: She exhibits no edema.  Lymphadenopathy:    She has no cervical adenopathy.  Neurological: She is alert and oriented to person, place, and time.  Skin: Skin is warm and dry.  No rash, lesions, erythema,or warmth noted on area at  bra line. Patient noted burning with palpation at her bra line from middle of back extending left only and did not note this sensation crossing the midline.       Assessment & Plan:  1. Herpes zoster without complication History is suspicious for early herpes zoster; no lesions present today; will treat with famciclovir today and advised follow up if symptoms do not improve with treatment, worsen, or she develops a fever >100. - famciclovir (FAMVIR) 500 MG tablet; Take 1 tablet (500 mg total) by mouth 3 (three) times daily.  Dispense: 21 tablet; Refill: 0  Roddie Mc, FNP-C

## 2016-05-30 NOTE — Patient Instructions (Signed)
It was a pleasure to see you today. If your symptoms do not improve with treatment, worsen, or you develop a fever >100, please follow up for further evaluation and treatment. Follow up with [facility] as recommended.  Shingles Shingles is an infection that causes a painful skin rash and fluid-filled blisters. Shingles is caused by the same virus that causes chickenpox. Shingles only develops in people who:  Have had chickenpox.  Have gotten the chickenpox vaccine. (This is rare.) The first symptoms of shingles may be itching, tingling, or pain in an area on your skin. A rash will follow in a few days or weeks. The rash is usually on one side of the body in a bandlike or beltlike pattern. Over time, the rash turns into fluid-filled blisters that break open, scab over, and dry up. Medicines may:  Help you manage pain.  Help you recover more quickly.  Help to prevent long-term problems. Follow these instructions at home: Medicines   Take medicines only as told by your doctor.  Apply an anti-itch or numbing cream to the affected area as told by your doctor. Blister and Rash Care   Take a cool bath or put cool compresses on the area of the rash or blisters as told by your doctor. This may help with pain and itching.  Keep your rash covered with a loose bandage (dressing). Wear loose-fitting clothing.  Keep your rash and blisters clean with mild soap and cool water or as told by your doctor.  Check your rash every day for signs of infection. These include redness, swelling, and pain that lasts or gets worse.  Do not pick your blisters.  Do not scratch your rash. General instructions   Rest as told by your doctor.  Keep all follow-up visits as told by your doctor. This is important.  Until your blisters scab over, your infection can cause chickenpox in people who have never had it or been vaccinated against it. To prevent this from happening, avoid touching other people or being around  other people, especially:  Babies.  Pregnant women.  Children who have eczema.  Elderly people who have transplants.  People who have chronic illnesses, such as leukemia or AIDS. Contact a doctor if:  Your pain does not get better with medicine.  Your pain does not get better after the rash heals.  Your rash looks infected. Signs of infection include:  Redness.  Swelling.  Pain that lasts or gets worse. Get help right away if:  The rash is on your face or nose.  You have pain in your face, pain around your eye area, or loss of feeling on one side of your face.  You have ear pain or you have ringing in your ear.  You have loss of taste.  Your condition gets worse. This information is not intended to replace advice given to you by your health care provider. Make sure you discuss any questions you have with your health care provider. Document Released: 07/20/2007 Document Revised: 09/27/2015 Document Reviewed: 11/12/2013 Elsevier Interactive Patient Education  2017 [employer].

## 2016-06-01 ENCOUNTER — Encounter: Payer: Self-pay | Admitting: Adult Health

## 2016-06-01 ENCOUNTER — Ambulatory Visit (INDEPENDENT_AMBULATORY_CARE_PROVIDER_SITE_OTHER): Payer: BLUE CROSS/BLUE SHIELD | Admitting: Adult Health

## 2016-06-01 VITALS — BP 126/78 | Temp 97.7°F | Wt 165.4 lb

## 2016-06-01 DIAGNOSIS — F909 Attention-deficit hyperactivity disorder, unspecified type: Secondary | ICD-10-CM

## 2016-06-01 DIAGNOSIS — E669 Obesity, unspecified: Secondary | ICD-10-CM | POA: Diagnosis not present

## 2016-06-01 DIAGNOSIS — Z6830 Body mass index (BMI) 30.0-30.9, adult: Secondary | ICD-10-CM

## 2016-06-01 DIAGNOSIS — Z0000 Encounter for general adult medical examination without abnormal findings: Secondary | ICD-10-CM | POA: Diagnosis not present

## 2016-06-01 DIAGNOSIS — Z1239 Encounter for other screening for malignant neoplasm of breast: Secondary | ICD-10-CM

## 2016-06-01 DIAGNOSIS — E890 Postprocedural hypothyroidism: Secondary | ICD-10-CM | POA: Diagnosis not present

## 2016-06-01 DIAGNOSIS — Z1231 Encounter for screening mammogram for malignant neoplasm of breast: Secondary | ICD-10-CM | POA: Diagnosis not present

## 2016-06-01 DIAGNOSIS — Z9889 Other specified postprocedural states: Secondary | ICD-10-CM

## 2016-06-01 LAB — CBC WITH DIFFERENTIAL/PLATELET
BASOS ABS: 0 10*3/uL (ref 0.0–0.1)
BASOS PCT: 0.8 % (ref 0.0–3.0)
EOS ABS: 0.1 10*3/uL (ref 0.0–0.7)
Eosinophils Relative: 2.8 % (ref 0.0–5.0)
HCT: 35.2 % — ABNORMAL LOW (ref 36.0–46.0)
Hemoglobin: 11.7 g/dL — ABNORMAL LOW (ref 12.0–15.0)
LYMPHS ABS: 1.6 10*3/uL (ref 0.7–4.0)
Lymphocytes Relative: 31.7 % (ref 12.0–46.0)
MCHC: 33.2 g/dL (ref 30.0–36.0)
MCV: 82.6 fl (ref 78.0–100.0)
MONOS PCT: 8.1 % (ref 3.0–12.0)
Monocytes Absolute: 0.4 10*3/uL (ref 0.1–1.0)
NEUTROS ABS: 2.8 10*3/uL (ref 1.4–7.7)
NEUTROS PCT: 56.6 % (ref 43.0–77.0)
PLATELETS: 268 10*3/uL (ref 150.0–400.0)
RBC: 4.26 Mil/uL (ref 3.87–5.11)
RDW: 13.3 % (ref 11.5–15.5)
WBC: 4.9 10*3/uL (ref 4.0–10.5)

## 2016-06-01 LAB — BASIC METABOLIC PANEL
BUN: 9 mg/dL (ref 6–23)
CALCIUM: 9.1 mg/dL (ref 8.4–10.5)
CO2: 26 meq/L (ref 19–32)
CREATININE: 0.79 mg/dL (ref 0.40–1.20)
Chloride: 106 mEq/L (ref 96–112)
GFR: 83.88 mL/min (ref >60.00)
GLUCOSE: 102 mg/dL — AB (ref 70–99)
Potassium: 4.2 mEq/L (ref 3.5–5.1)
Sodium: 138 mEq/L (ref 135–145)

## 2016-06-01 LAB — TSH: TSH: 2.03 u[IU]/mL (ref 0.35–4.50)

## 2016-06-01 LAB — HEPATIC FUNCTION PANEL
ALBUMIN: 4.3 g/dL (ref 3.5–5.2)
ALK PHOS: 54 U/L (ref 39–117)
ALT: 9 U/L (ref 0–35)
AST: 11 U/L (ref 0–37)
Bilirubin, Direct: 0.1 mg/dL (ref 0.0–0.3)
Total Bilirubin: 0.5 mg/dL (ref 0.2–1.2)
Total Protein: 6.8 g/dL (ref 6.0–8.3)

## 2016-06-01 LAB — LIPID PANEL
Cholesterol: 189 mg/dL (ref 0–200)
HDL: 82.8 mg/dL (ref >39.00)
LDL Cholesterol: 93 mg/dL (ref 0–99)
NONHDL: 106.32
TRIGLYCERIDES: 69 mg/dL (ref 0.0–149.0)
Total CHOL/HDL Ratio: 2
VLDL: 13.8 mg/dL (ref 0.0–40.0)

## 2016-06-01 MED ORDER — AMPHETAMINE-DEXTROAMPHETAMINE 10 MG PO TABS: 10.0000 mg | ORAL_TABLET | Freq: Every day | ORAL | 0 refills | Status: DC

## 2016-06-01 NOTE — Progress Notes (Signed)
Subjective:    Patient ID: Whitney Gordon, female    DOB: 02-Jun-1970, 46 y.o.   MRN: [id number]  HPI  Patient presents for yearly preventative medicine examination. She is a pleasant 46 year old female who  has a past medical history of ADHD (attention deficit hyperactivity disorder) and Depression.   All immunizations and health maintenance protocols were reviewed with the patient and needed orders were placed. She is UTD   Appropriate screening laboratory values were ordered for the patient including screening of hyperlipidemia, renal function and hepatic function.  Medication reconciliation,  past medical history, social history, problem list and allergies were reviewed in detail with the patient  Goals were established with regard to weight loss, exercise, and  diet in compliance with medications. She eats healthy and exercises on a consistent basis with a Systems analyst. She has been unable to lose any weight.   She is going to see a GYN, and is due for a mammogram   She is happy to report that she is moving to [place] for 6 months, starting in June   Review of Systems  Constitutional: Negative.   HENT: Negative.   Eyes: Negative.   Respiratory: Negative.   Cardiovascular: Negative.   Gastrointestinal: Negative.   Endocrine: Negative.   Genitourinary: Negative.   Musculoskeletal: Negative.   Allergic/Immunologic: Negative.   Neurological: Negative.   Hematological: Negative.   Psychiatric/Behavioral: Negative.   All other systems reviewed and are negative.  Past Medical History:  Diagnosis Date  . ADHD (attention deficit hyperactivity disorder)   . Depression     Social History   Social History  . Marital status: Married    Spouse name: N/A  . Number of children: N/A  . Years of education: N/A   Occupational History  . Not on file.   Social History Main Topics  . Smoking status: Never Smoker  . Smokeless tobacco: Never Used  . Alcohol use 0.0  oz/week     Comment: 1 glass of wine 2 to 3 times a week with dinner  . Drug use: No  . Sexual activity: Not on file   Other Topics Concern  . 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    Married    Daughter who is ten. Has two adopted children    Masters in HR development.    She likes to cook and travel.     Past Surgical History:  Procedure Laterality Date  . THYROIDECTOMY  2007    Family History  Problem Relation Age of Onset  . Breast cancer Paternal Grandmother   . Ovarian cancer Paternal Grandmother   . Heart disease Father   . Sudden death Father 54  . Hypertension Mother   . Diabetes type II Mother     Allergies  Allergen Reactions  . Tetracycline     Other reaction(s): Other (See Comments) Other Reaction: Throat burning, upset stomach    Current Outpatient Prescriptions on File Prior to Visit  Medication Sig Dispense Refill  . amphetamine-dextroamphetamine (ADDERALL) 10 MG tablet Take 1 tablet (10 mg total) by mouth daily with breakfast. 30 tablet 0  . famciclovir (FAMVIR) 500 MG tablet Take 1 tablet (500 mg total) by mouth 3 (three) times daily. 21 tablet 0   No current facility-administered medications on file prior to visit.     BP 126/78 (BP Location: Left Arm, Patient Position: Sitting, Cuff Size: Normal)  Temp 97.7 F (36.5 C) (Oral)   Wt 165 lb 6.4 oz (75 kg)   BMI 28.24 kg/m       Objective:   Physical Exam  Constitutional: She is oriented to person, place, and time. She appears well-developed and well-nourished. No distress.  HENT:  Head: Normocephalic and atraumatic.  Right Ear: External ear normal.  Left Ear: External ear normal.  Nose: Nose normal.  Mouth/Throat: Oropharynx is clear and moist. No oropharyngeal exudate.  Eyes: Conjunctivae are normal. Pupils are equal, round, and reactive to light. Right eye exhibits no discharge. Left eye exhibits no discharge. No scleral icterus.    Neck: Normal range of motion. Neck supple. No JVD present. No tracheal deviation present. No thyromegaly present.  Cardiovascular: Normal rate, regular rhythm, normal heart sounds and intact distal pulses.  Exam reveals no gallop and no friction rub.   No murmur heard. Pulmonary/Chest: Effort normal and breath sounds normal. No respiratory distress. She has no wheezes. She has no rales. She exhibits no tenderness.  Abdominal: Soft. Bowel sounds are normal. She exhibits no distension and no mass. There is no tenderness. There is no rebound and no guarding.  Genitourinary:  Genitourinary Comments: Refused    Musculoskeletal: Normal range of motion. She exhibits no edema, tenderness or deformity.  Lymphadenopathy:    She has no cervical adenopathy.  Neurological: She is alert and oriented to person, place, and time. She has normal reflexes. She displays normal reflexes. No cranial nerve deficit. She exhibits normal muscle tone. Coordination normal.  Skin: Skin is warm and dry. No rash noted. She is not diaphoretic. No erythema. No pallor.  Psychiatric: She has a normal mood and affect. Her behavior is normal. Judgment and thought content normal.  Nursing note and vitals reviewed.     Assessment & Plan:  1. Attention deficit hyperactivity disorder (ADHD), unspecified ADHD type  - amphetamine-dextroamphetamine (ADDERALL) 10 MG tablet; Take 1 tablet (10 mg total) by mouth daily with breakfast.  Dispense: 30 tablet; Refill: 0  2. Routine general medical examination at a health care facility  - MM DIGITAL SCREENING BILATERAL; Future - Basic metabolic panel - CBC with Differential/Platelet - Hepatic function panel - Lipid panel - TSH - Follow up in one year  3. Breast cancer screening  - MM DIGITAL SCREENING BILATERAL; Future  4. S/P thyroidectomy  - Basic metabolic panel - CBC with Differential/Platelet - Hepatic function panel - Lipid panel - TSH  5. Class 1 obesity without  serious comorbidity with body mass index (BMI) of 30.0 to 30.9 in adult, unspecified obesity type  - Amb Ref to Medical Weight Management   Shirline Frees, NP

## 2016-06-01 NOTE — Patient Instructions (Addendum)
[facility]  Address: [address], [place], [place] [address] Phone: [phone]  Someone will call you from the weight loss clinic to schedule your appointment.   Follow up with me as needed  Have a great time in [place]!

## 2016-06-07 ENCOUNTER — Other Ambulatory Visit: Payer: Self-pay | Admitting: Adult Health

## 2016-06-07 DIAGNOSIS — Z1231 Encounter for screening mammogram for malignant neoplasm of breast: Secondary | ICD-10-CM

## 2016-06-09 ENCOUNTER — Encounter: Payer: Self-pay | Admitting: Radiology

## 2016-06-09 ENCOUNTER — Ambulatory Visit
Admission: RE | Admit: 2016-06-09 | Discharge: 2016-06-09 | Disposition: A | Discharge status: Home / Self Care | Payer: BLUE CROSS/BLUE SHIELD | Source: Ambulatory Visit | Attending: Adult Health | Admitting: Adult Health

## 2016-06-09 DIAGNOSIS — Z1231 Encounter for screening mammogram for malignant neoplasm of breast: Secondary | ICD-10-CM

## 2016-06-17 DIAGNOSIS — Z30430 Encounter for insertion of intrauterine contraceptive device: Secondary | ICD-10-CM | POA: Diagnosis not present

## 2016-06-17 DIAGNOSIS — Z01419 Encounter for gynecological examination (general) (routine) without abnormal findings: Secondary | ICD-10-CM | POA: Diagnosis not present

## 2017-07-28 DIAGNOSIS — Z01419 Encounter for gynecological examination (general) (routine) without abnormal findings: Secondary | ICD-10-CM | POA: Diagnosis not present

## 2017-07-28 DIAGNOSIS — Z1329 Encounter for screening for other suspected endocrine disorder: Secondary | ICD-10-CM | POA: Diagnosis not present

## 2017-07-28 DIAGNOSIS — Z0000 Encounter for general adult medical examination without abnormal findings: Secondary | ICD-10-CM | POA: Diagnosis not present

## 2017-08-01 ENCOUNTER — Other Ambulatory Visit: Payer: Self-pay | Admitting: Adult Health

## 2017-08-01 DIAGNOSIS — Z1231 Encounter for screening mammogram for malignant neoplasm of breast: Secondary | ICD-10-CM

## 2017-08-23 ENCOUNTER — Ambulatory Visit
Admission: RE | Admit: 2017-08-23 | Discharge: 2017-08-23 | Disposition: A | Discharge status: Home / Self Care | Payer: BLUE CROSS/BLUE SHIELD | Source: Ambulatory Visit | Attending: Adult Health | Admitting: Adult Health

## 2017-08-23 DIAGNOSIS — Z1231 Encounter for screening mammogram for malignant neoplasm of breast: Secondary | ICD-10-CM

## 2018-07-13 ENCOUNTER — Other Ambulatory Visit: Payer: Self-pay | Admitting: Adult Health

## 2018-07-13 DIAGNOSIS — Z1231 Encounter for screening mammogram for malignant neoplasm of breast: Secondary | ICD-10-CM

## 2018-08-30 ENCOUNTER — Ambulatory Visit
Admission: RE | Admit: 2018-08-30 | Discharge: 2018-08-30 | Disposition: A | Discharge status: Home / Self Care | Payer: Self-pay | Source: Ambulatory Visit | Attending: Adult Health | Admitting: Adult Health

## 2018-08-30 DIAGNOSIS — Z1231 Encounter for screening mammogram for malignant neoplasm of breast: Secondary | ICD-10-CM

## 2019-04-12 ENCOUNTER — Encounter: Payer: Self-pay | Admitting: Adult Health

## 2019-04-12 ENCOUNTER — Ambulatory Visit (INDEPENDENT_AMBULATORY_CARE_PROVIDER_SITE_OTHER): Payer: BC Managed Care – PPO

## 2019-04-12 ENCOUNTER — Other Ambulatory Visit: Payer: Self-pay

## 2019-04-12 ENCOUNTER — Telehealth: Payer: Self-pay | Admitting: Adult Health

## 2019-04-12 ENCOUNTER — Ambulatory Visit (INDEPENDENT_AMBULATORY_CARE_PROVIDER_SITE_OTHER): Payer: BC Managed Care – PPO | Admitting: Adult Health

## 2019-04-12 VITALS — BP 114/76 | Temp 97.7°F | Ht 65.0 in | Wt 156.0 lb

## 2019-04-12 DIAGNOSIS — M25561 Pain in right knee: Secondary | ICD-10-CM

## 2019-04-12 DIAGNOSIS — Z760 Encounter for issue of repeat prescription: Secondary | ICD-10-CM

## 2019-04-12 DIAGNOSIS — Z0000 Encounter for general adult medical examination without abnormal findings: Secondary | ICD-10-CM

## 2019-04-12 DIAGNOSIS — F909 Attention-deficit hyperactivity disorder, unspecified type: Secondary | ICD-10-CM

## 2019-04-12 DIAGNOSIS — Z0001 Encounter for general adult medical examination with abnormal findings: Secondary | ICD-10-CM | POA: Diagnosis not present

## 2019-04-12 LAB — COMPREHENSIVE METABOLIC PANEL
ALT: 12 U/L (ref 0–35)
AST: 13 U/L (ref 0–37)
Albumin: 4.4 g/dL (ref 3.5–5.2)
Alkaline Phosphatase: 48 U/L (ref 39–117)
BUN: 9 mg/dL (ref 6–23)
CO2: 27 mEq/L (ref 19–32)
Calcium: 9.6 mg/dL (ref 8.4–10.5)
Chloride: 105 mEq/L (ref 96–112)
Creatinine, Ser: 0.77 mg/dL (ref 0.40–1.20)
GFR: 80.26 mL/min (ref >60.00)
Glucose, Bld: 90 mg/dL (ref 70–99)
Potassium: 4.2 mEq/L (ref 3.5–5.1)
Sodium: 139 mEq/L (ref 135–145)
Total Bilirubin: 0.7 mg/dL (ref 0.2–1.2)
Total Protein: 7.2 g/dL (ref 6.0–8.3)

## 2019-04-12 LAB — CBC WITH DIFFERENTIAL/PLATELET
Basophils Absolute: 0 10*3/uL (ref 0.0–0.1)
Basophils Relative: 0.8 % (ref 0.0–3.0)
Eosinophils Absolute: 0.2 10*3/uL (ref 0.0–0.7)
Eosinophils Relative: 3.1 % (ref 0.0–5.0)
HCT: 42.1 % (ref 36.0–46.0)
Hemoglobin: 14.5 g/dL (ref 12.0–15.0)
Lymphocytes Relative: 32.3 % (ref 12.0–46.0)
Lymphs Abs: 1.8 10*3/uL (ref 0.7–4.0)
MCHC: 34.4 g/dL (ref 30.0–36.0)
MCV: 90.9 fl (ref 78.0–100.0)
Monocytes Absolute: 0.5 10*3/uL (ref 0.1–1.0)
Monocytes Relative: 8.8 % (ref 3.0–12.0)
Neutro Abs: 3 10*3/uL (ref 1.4–7.7)
Neutrophils Relative %: 55 % (ref 43.0–77.0)
Platelets: 208 10*3/uL (ref 150.0–400.0)
RBC: 4.63 Mil/uL (ref 3.87–5.11)
RDW: 12.8 % (ref 11.5–15.5)
WBC: 5.4 10*3/uL (ref 4.0–10.5)

## 2019-04-12 LAB — TSH: TSH: 2.02 u[IU]/mL (ref 0.35–4.50)

## 2019-04-12 LAB — LIPID PANEL
Cholesterol: 198 mg/dL (ref 0–200)
HDL: 81.1 mg/dL (ref >39.00)
LDL Cholesterol: 105 mg/dL — ABNORMAL HIGH (ref 0–99)
NonHDL: 116.73
Total CHOL/HDL Ratio: 2
Triglycerides: 61 mg/dL (ref 0.0–149.0)
VLDL: 12.2 mg/dL (ref 0.0–40.0)

## 2019-04-12 MED ORDER — AMPHETAMINE-DEXTROAMPHETAMINE 5 MG PO TABS: 10.0000 mg | ORAL_TABLET | Freq: Every day | ORAL | 0 refills | Status: DC

## 2019-04-12 MED ORDER — AMPHETAMINE-DEXTROAMPHETAMINE 5 MG PO TABS: 5.0000 mg | ORAL_TABLET | Freq: Every day | ORAL | 0 refills | Status: AC

## 2019-04-12 NOTE — Telephone Encounter (Signed)
Take one tablet daily 

## 2019-04-12 NOTE — Progress Notes (Signed)
Subjective:    Patient ID: Whitney Gordon, female    DOB: 09/16/1970, 49 y.o.   MRN: [id number]  HPI Patient presents for yearly preventative medicine examination. She is a pleasant 49 year old female who  has a past medical history of ADHD (attention deficit hyperactivity disorder) and Depression.  She was last seen in the office in April 2018 at which time she was moving to [place] for 6 months.  She ended up staying for 9 months and returned approximately 1 year ago.  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  ADHD -the past has been prescribed Adderall 10 mg daily took this on a as needed basis.  She has noticed that since starting in real estate that she has had to take it more often in order to complete work tasks, she has been splitting her 10 mg pills in half and feels as though this dose works well for her.  Her biggest complaint is that of right knee discomfort.  She reports the knee discomfort is minimal but when she is doing squats her changes position she will feel a "crunching sensation" in her right knee.  Does report that she was walking her dog not too long ago and fell onto the pavement injuring her right knee, since then the sensation of crunching has been present.  She has no problems doing activities of daily living or exercising.  She has not noticed any redness, warmth, or swelling  All immunizations and health maintenance protocols were reviewed with the patient and needed orders were placed. UTD on vaccinations   Appropriate screening laboratory values were ordered for the patient including screening of hyperlipidemia, renal function and hepatic function.  Medication reconciliation,  past medical history, social history, problem list and allergies were reviewed in detail with the patient  Goals were established with regard to weight loss, exercise, and  diet in compliance with medications.  She is eating a heart healthy diet as well as  exercising on a routine basis  Wt Readings from Last 3 Encounters:  04/12/19 156 lb (70.8 kg)  06/01/16 165 lb 6.4 oz (75 kg)  05/30/16 165 lb 9.6 oz (75.1 kg)   GYN is no longer taking her insurance, she is looking for a new GYN  Review of Systems  Constitutional: Negative.   HENT: Negative.   Eyes: Negative.   Respiratory: Negative.   Cardiovascular: Negative.   Gastrointestinal: Negative.   Endocrine: Negative.   Genitourinary: Negative.   Musculoskeletal: Positive for arthralgias.  Skin: Negative.   Allergic/Immunologic: Negative.   Neurological: Negative.   Hematological: Negative.   Psychiatric/Behavioral: Negative.    Past Medical History:  Diagnosis Date  . ADHD (attention deficit hyperactivity disorder)   . Depression     Social History   Socioeconomic History  . Marital status: Married    Spouse name: Not on file  . Number of children: Not on file  . Years of education: Not on file  . Highest education level: Not on file  Occupational History  . Not on file  Tobacco Use  . Smoking status: Never Smoker  . Smokeless tobacco: Never Used  Substance and Sexual Activity  . Alcohol use: Yes    Alcohol/week: 0.0 standard drinks    Comment: 1 glass of wine 5 X wkly  . Drug use: No  . Sexual activity: Not on file  Other Topics Concern  . 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    Married    Daughter who is ten. Has two adopted children    Masters in HR development.    She likes to cook and travel.    Social Determinants of Health   Financial Resource Strain:   . Difficulty of Paying Living Expenses: Not on file  Food Insecurity:   . Worried About Programme researcher, broadcasting/film/video in the Last Year: Not on file  . Ran Out of Food in the Last Year: Not on file  Transportation Needs:   . Lack of Transportation (Medical): Not on file  . Lack of Transportation (Non-Medical): Not on file  Physical Activity:   . Days  of Exercise per Week: Not on file  . Minutes of Exercise per Session: Not on file  Stress:   . Feeling of Stress : Not on file  Social Connections:   . Frequency of Communication with Friends and Family: Not on file  . Frequency of Social Gatherings with Friends and Family: Not on file  . Attends Religious Services: Not on file  . Active Member of Clubs or Organizations: Not on file  . Attends Banker Meetings: Not on file  . Marital Status: Not on file  Intimate Partner Violence:   . Fear of Current or Ex-Partner: Not on file  . Emotionally Abused: Not on file  . Physically Abused: Not on file  . Sexually Abused: Not on file    Past Surgical History:  Procedure Laterality Date  . BREAST CYST ASPIRATION Left   . THYROIDECTOMY  2007    Family History  Problem Relation Age of Onset  . Ovarian cancer Paternal Grandmother   . Breast cancer Paternal Grandmother   . Heart disease Father   . Sudden death Father 66  . Hypertension Mother   . Diabetes type II Mother     Allergies  Allergen Reactions  . Tetracycline     Other reaction(s): Other (See Comments) Other Reaction: Throat burning, upset stomach    Current Outpatient Medications on File Prior to Visit  Medication Sig Dispense Refill  . amphetamine-dextroamphetamine (ADDERALL) 10 MG tablet Take 1 tablet (10 mg total) by mouth daily with breakfast. 30 tablet 0   No current facility-administered medications on file prior to visit.    BP 114/76   Temp 97.7 F (36.5 C)   Ht 5\' 5"  (1.651 m) Comment: WITHOUT SHOES  Wt 156 lb (70.8 kg)   BMI 25.96 kg/m       Objective:   Physical Exam Vitals and nursing note reviewed.  Constitutional:      General: She is not in acute distress.    Appearance: Normal appearance. She is well-developed. She is not ill-appearing.  HENT:     Head: Normocephalic and atraumatic.     Right Ear: Tympanic membrane, ear canal and external ear normal. There is no impacted  cerumen.     Left Ear: Tympanic membrane, ear canal and external ear normal. There is no impacted cerumen.     Nose: Nose normal. No congestion or rhinorrhea.     Mouth/Throat:     Mouth: Mucous membranes are moist.     Pharynx: Oropharynx is clear. No oropharyngeal exudate or posterior oropharyngeal erythema.  Eyes:     General:        Right eye: No discharge.        Left eye: No discharge.     Extraocular Movements: Extraocular movements intact.  Conjunctiva/sclera: Conjunctivae normal.     Pupils: Pupils are equal, round, and reactive to light.  Neck:     Thyroid: No thyromegaly.     Vascular: No carotid bruit.     Trachea: No tracheal deviation.  Cardiovascular:     Rate and Rhythm: Normal rate and regular rhythm.     Pulses: Normal pulses.     Heart sounds: Normal heart sounds. No murmur. No friction rub. No gallop.   Pulmonary:     Effort: Pulmonary effort is normal. No respiratory distress.     Breath sounds: Normal breath sounds. No stridor. No wheezing, rhonchi or rales.  Chest:     Chest wall: No tenderness.  Abdominal:     General: Abdomen is flat. Bowel sounds are normal. There is no distension.     Palpations: Abdomen is soft. There is no mass.     Tenderness: There is no abdominal tenderness. There is no right CVA tenderness, left CVA tenderness, guarding or rebound.     Hernia: No hernia is present.  Musculoskeletal:        General: No swelling, deformity or signs of injury. Normal range of motion.     Cervical back: Normal range of motion and neck supple.     Right knee: No swelling, deformity, effusion, erythema or crepitus. Normal range of motion. Tenderness present over the medial joint line, lateral joint line, MCL and LCL. No ACL, PCL or patellar tendon tenderness. Normal alignment, normal meniscus and normal patellar mobility.     Right lower leg: No edema.     Left lower leg: No edema.  Lymphadenopathy:     Cervical: No cervical adenopathy.  Skin:     General: Skin is warm and dry.     Coloration: Skin is not jaundiced or pale.     Findings: No bruising, erythema, lesion or rash.  Neurological:     General: No focal deficit present.     Mental Status: She is alert and oriented to person, place, and time.     Cranial Nerves: No cranial nerve deficit.     Sensory: No sensory deficit.     Motor: No weakness.     Coordination: Coordination normal.     Gait: Gait normal.     Deep Tendon Reflexes: Reflexes normal.  Psychiatric:        Mood and Affect: Mood normal.        Behavior: Behavior normal.        Thought Content: Thought content normal.        Judgment: Judgment normal.       Assessment & Plan:  1. Routine general medical examination at a health care facility - Benign exam  - Follow up in one year or sooner if needed - CBC with Differential/Platelet - Comprehensive metabolic panel - Lipid panel - TSH  2. Attention deficit hyperactivity disorder (ADHD), unspecified ADHD type  - amphetamine-dextroamphetamine (ADDERALL) 5 MG tablet; Take 2 tablets (10 mg total) by mouth daily with breakfast.  Dispense: 30 tablet; Refill: 0 - amphetamine-dextroamphetamine (ADDERALL) 5 MG tablet; Take 1 tablet (5 mg total) by mouth daily with breakfast.  Dispense: 30 tablet; Refill: 0 - amphetamine-dextroamphetamine (ADDERALL) 5 MG tablet; Take 1 tablet (5 mg total) by mouth daily.  Dispense: 30 tablet; Refill: 0  3. Acute pain of right knee -Meniscus was completely normal, she did have some mild tenderness to MCL and ACL.  Will get x-ray today in the office and consider MRI in  the future - DG Knee 1-2 Views Right; Future - DG Knee 1-2 Views Right   Shirline Frees, NP

## 2019-04-12 NOTE — Telephone Encounter (Signed)
Whitney Gordon, should the pt take 1 or 2 tabs?

## 2019-04-12 NOTE — Telephone Encounter (Signed)
Called and spoke to Ron at the pharmacy.  He needs the prescription to be changed to 1 tab daily and resent to the pharmacy.  Please resend.  Thanks

## 2019-04-12 NOTE — Telephone Encounter (Signed)
Ron from [facility] needed to clarify how pt needs to take Adderall that was sent in.   [facility]- [phone]

## 2019-11-26 DIAGNOSIS — Z20822 Contact with and (suspected) exposure to covid-19: Secondary | ICD-10-CM | POA: Diagnosis not present

## 2019-11-26 DIAGNOSIS — Z03818 Encounter for observation for suspected exposure to other biological agents ruled out: Secondary | ICD-10-CM | POA: Diagnosis not present

## 2019-11-27 ENCOUNTER — Other Ambulatory Visit: Payer: Self-pay | Admitting: Adult Health

## 2019-11-27 DIAGNOSIS — Z1231 Encounter for screening mammogram for malignant neoplasm of breast: Secondary | ICD-10-CM

## 2019-12-02 ENCOUNTER — Other Ambulatory Visit: Payer: Self-pay

## 2019-12-02 ENCOUNTER — Ambulatory Visit
Admission: RE | Admit: 2019-12-02 | Discharge: 2019-12-02 | Disposition: A | Discharge status: Home / Self Care | Payer: BC Managed Care – PPO | Source: Ambulatory Visit | Attending: Adult Health | Admitting: Adult Health

## 2019-12-02 DIAGNOSIS — Z1231 Encounter for screening mammogram for malignant neoplasm of breast: Secondary | ICD-10-CM

## 2020-05-12 DIAGNOSIS — Z20822 Contact with and (suspected) exposure to covid-19: Secondary | ICD-10-CM | POA: Diagnosis not present

## 2020-05-13 DIAGNOSIS — Z20822 Contact with and (suspected) exposure to covid-19: Secondary | ICD-10-CM | POA: Diagnosis not present

## 2020-11-04 DIAGNOSIS — Z13220 Encounter for screening for lipoid disorders: Secondary | ICD-10-CM | POA: Diagnosis not present

## 2020-11-04 DIAGNOSIS — Z0000 Encounter for general adult medical examination without abnormal findings: Secondary | ICD-10-CM | POA: Diagnosis not present

## 2020-11-04 DIAGNOSIS — F988 Other specified behavioral and emotional disorders with onset usually occurring in childhood and adolescence: Secondary | ICD-10-CM | POA: Diagnosis not present

## 2020-11-05 DIAGNOSIS — Z13220 Encounter for screening for lipoid disorders: Secondary | ICD-10-CM | POA: Diagnosis not present

## 2020-11-05 DIAGNOSIS — Z0000 Encounter for general adult medical examination without abnormal findings: Secondary | ICD-10-CM | POA: Diagnosis not present

## 2020-12-02 DIAGNOSIS — F902 Attention-deficit hyperactivity disorder, combined type: Secondary | ICD-10-CM | POA: Diagnosis not present

## 2021-02-16 ENCOUNTER — Other Ambulatory Visit: Payer: Self-pay | Admitting: Family Medicine

## 2021-02-16 DIAGNOSIS — Z1231 Encounter for screening mammogram for malignant neoplasm of breast: Secondary | ICD-10-CM

## 2021-02-17 ENCOUNTER — Ambulatory Visit
Admission: RE | Admit: 2021-02-17 | Discharge: 2021-02-17 | Disposition: A | Discharge status: Home / Self Care | Payer: BC Managed Care – PPO | Source: Ambulatory Visit | Attending: Family Medicine | Admitting: Family Medicine

## 2021-02-17 DIAGNOSIS — Z1231 Encounter for screening mammogram for malignant neoplasm of breast: Secondary | ICD-10-CM | POA: Diagnosis not present

## 2021-03-10 DIAGNOSIS — Z01419 Encounter for gynecological examination (general) (routine) without abnormal findings: Secondary | ICD-10-CM | POA: Diagnosis not present

## 2021-03-12 DIAGNOSIS — K648 Other hemorrhoids: Secondary | ICD-10-CM | POA: Diagnosis not present

## 2021-03-12 DIAGNOSIS — Z1211 Encounter for screening for malignant neoplasm of colon: Secondary | ICD-10-CM | POA: Diagnosis not present

## 2021-06-03 DIAGNOSIS — F902 Attention-deficit hyperactivity disorder, combined type: Secondary | ICD-10-CM | POA: Diagnosis not present

## 2021-11-17 DIAGNOSIS — Z0000 Encounter for general adult medical examination without abnormal findings: Secondary | ICD-10-CM | POA: Diagnosis not present

## 2021-11-17 DIAGNOSIS — Z13220 Encounter for screening for lipoid disorders: Secondary | ICD-10-CM | POA: Diagnosis not present

## 2021-11-17 DIAGNOSIS — F902 Attention-deficit hyperactivity disorder, combined type: Secondary | ICD-10-CM | POA: Diagnosis not present

## 2022-02-16 DIAGNOSIS — R059 Cough, unspecified: Secondary | ICD-10-CM | POA: Diagnosis not present

## 2022-03-16 DIAGNOSIS — Z01419 Encounter for gynecological examination (general) (routine) without abnormal findings: Secondary | ICD-10-CM | POA: Diagnosis not present

## 2022-03-28 ENCOUNTER — Other Ambulatory Visit: Payer: Self-pay | Admitting: Family Medicine

## 2022-03-28 DIAGNOSIS — Z1231 Encounter for screening mammogram for malignant neoplasm of breast: Secondary | ICD-10-CM

## 2022-03-31 ENCOUNTER — Ambulatory Visit
Admission: RE | Admit: 2022-03-31 | Discharge: 2022-03-31 | Disposition: A | Discharge status: Home / Self Care | Payer: Self-pay | Source: Ambulatory Visit | Attending: Family Medicine | Admitting: Family Medicine

## 2022-03-31 DIAGNOSIS — Z1231 Encounter for screening mammogram for malignant neoplasm of breast: Secondary | ICD-10-CM

## 2022-07-06 DIAGNOSIS — F902 Attention-deficit hyperactivity disorder, combined type: Secondary | ICD-10-CM | POA: Diagnosis not present

## 2022-10-09 IMAGING — MG MM DIGITAL SCREENING BILAT W/ TOMO AND CAD
8 series · 8 of 24 positions shown · non-contrast
Comparison: Previous exam(s).

CLINICAL DATA: Screening.

EXAM:
DIGITAL SCREENING BILATERAL MAMMOGRAM WITH TOMOSYNTHESIS AND CAD
TECHNIQUE: Bilateral screening digital craniocaudal and mediolateral oblique
mammograms were obtained. Bilateral screening digital breast
tomosynthesis was performed. The images were evaluated with
computer-aided detection.

[L CC synth-2D]
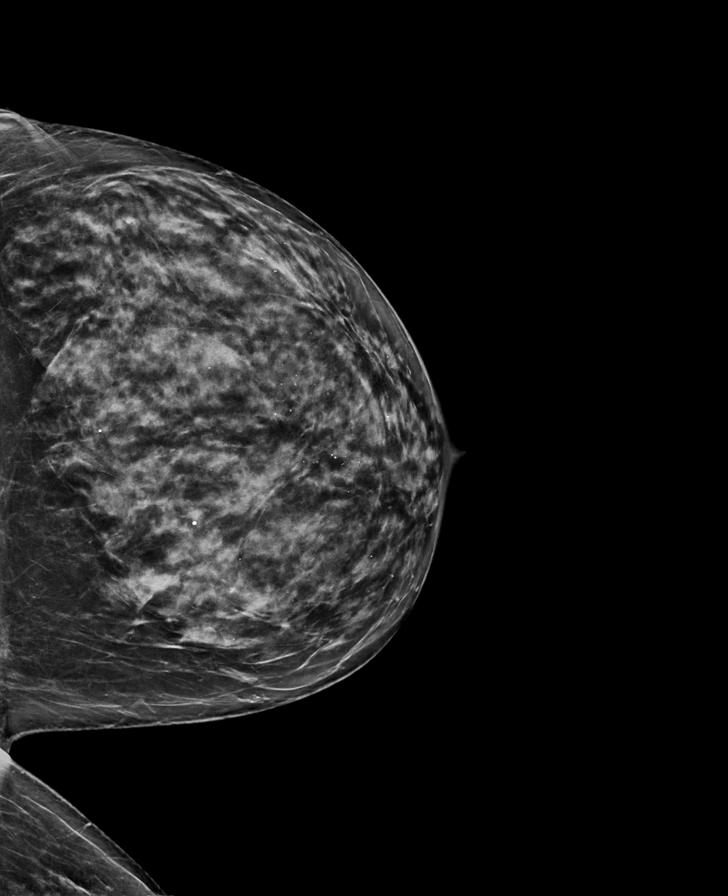

[R MLO synth-2D]
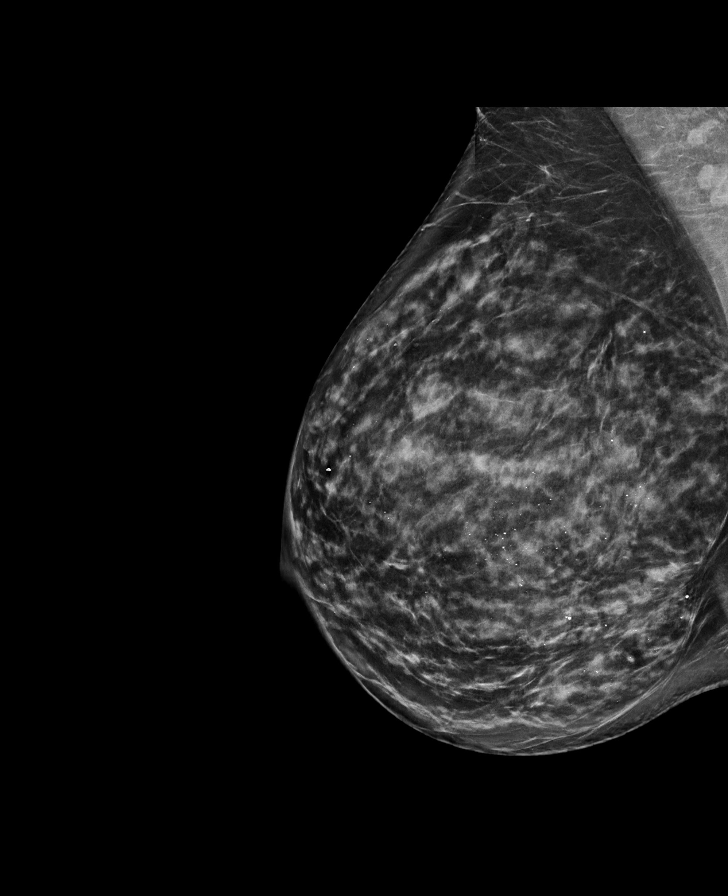

[R CC synth-2D]
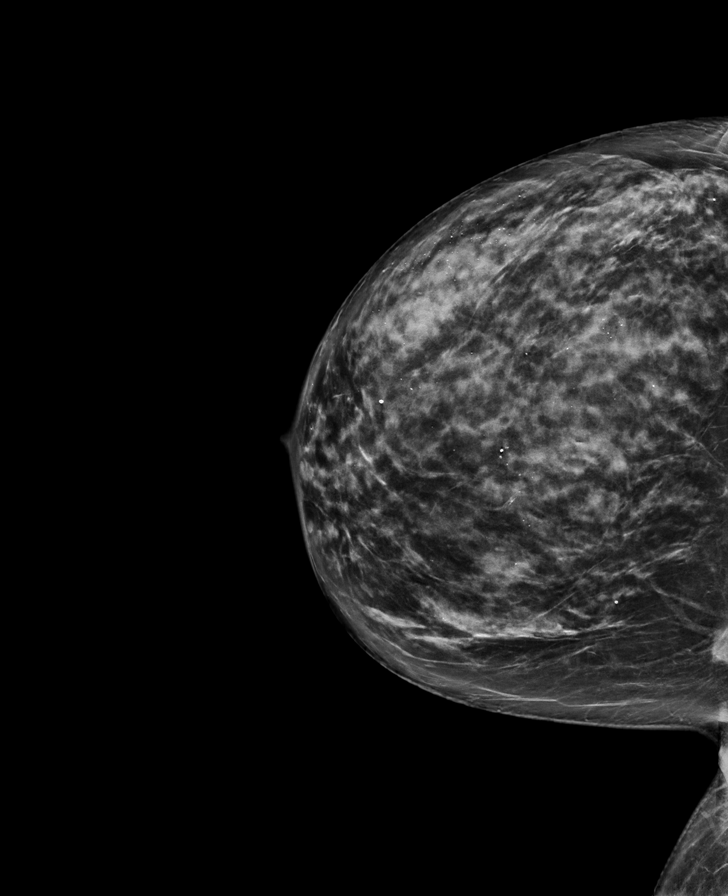

[L MLO synth-2D]
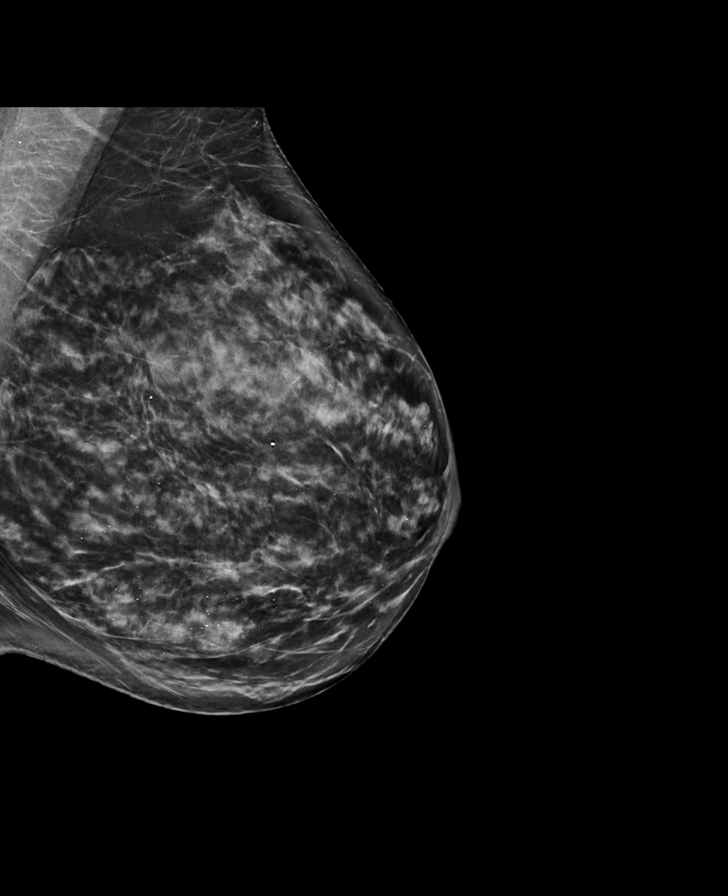

[R MLO tomo · tomo slice 41/81.0]
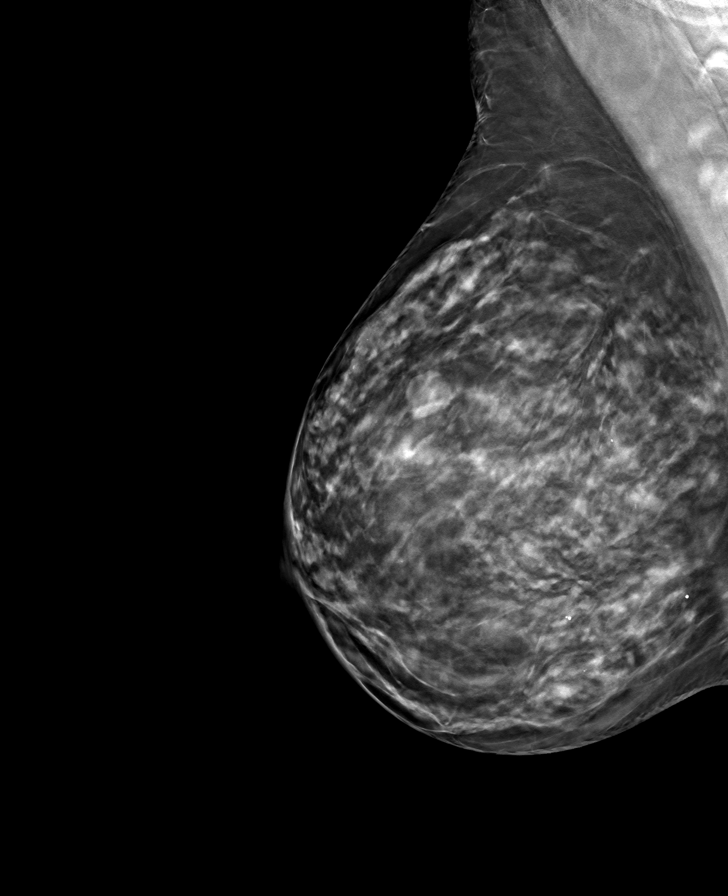

[L CC tomo · tomo slice 37/73.0]
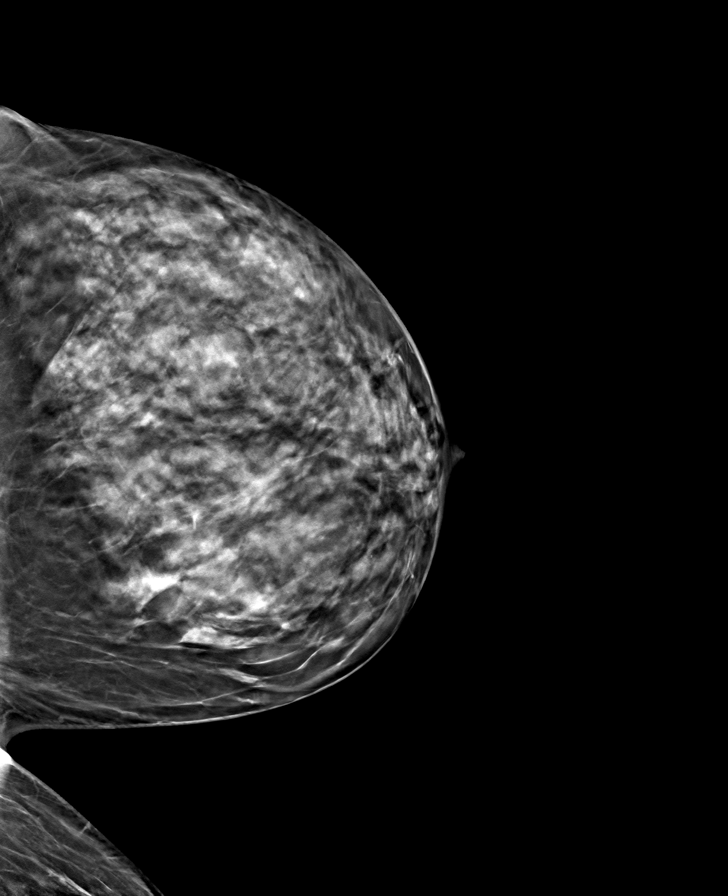

[R CC tomo · tomo slice 39/76.0]
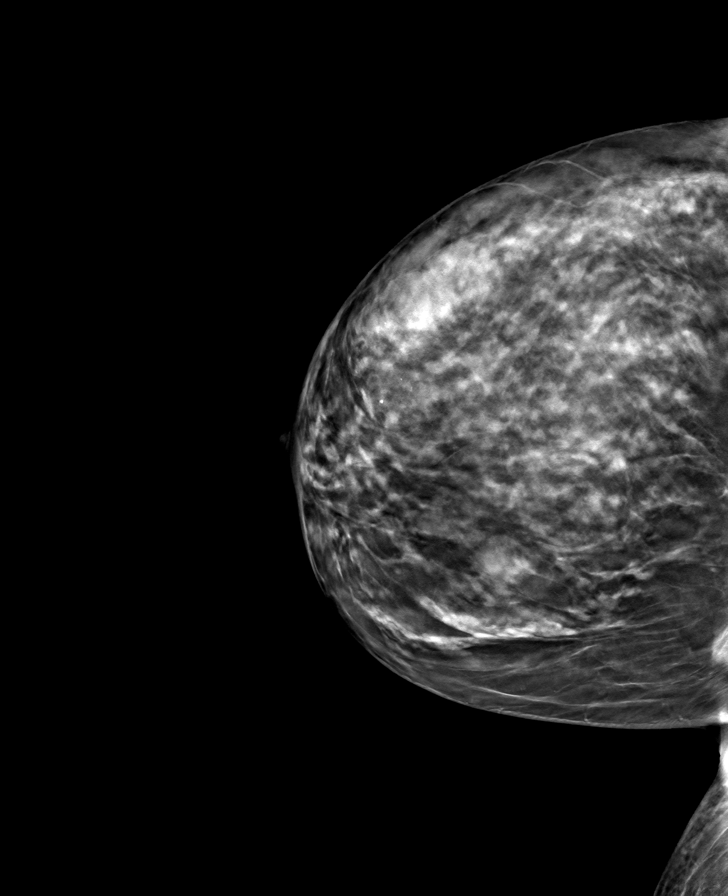

[L MLO tomo · tomo slice 39/77.0]
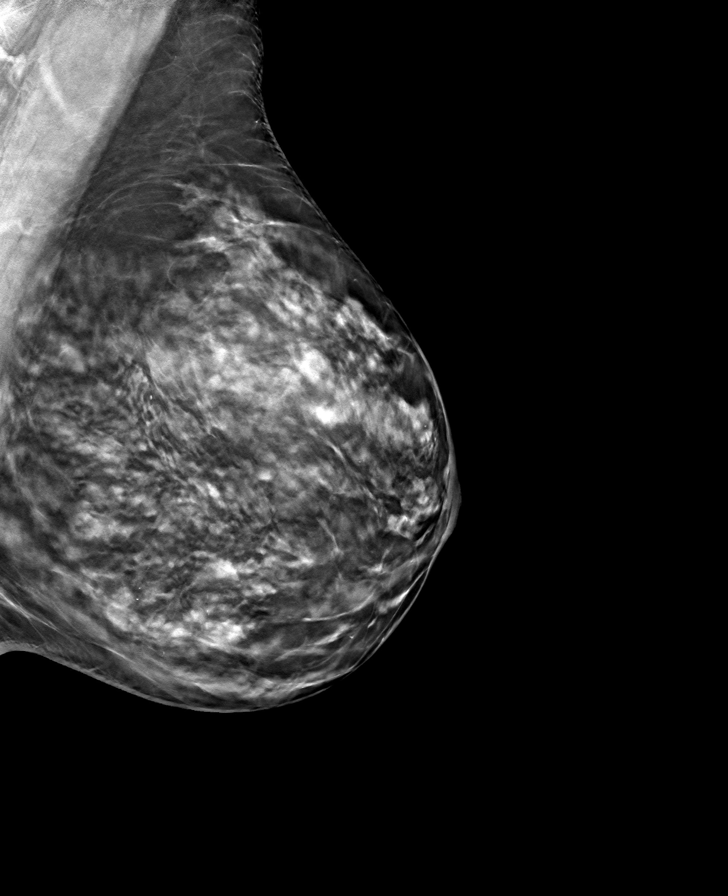

[8 of 24 positions shown; findings below may reference images not displayed]

ACR Breast Density Category c: The breast tissue is heterogeneously
dense, which may obscure small masses.
FINDINGS: There are no findings suspicious for malignancy.
IMPRESSION: No mammographic evidence of malignancy. A result letter of this
screening mammogram will be mailed directly to the patient.

RECOMMENDATION:
Screening mammogram in one year. (Code:Q3-W-BC3)

BI-RADS CATEGORY  1: Negative.

## 2022-11-30 DIAGNOSIS — Z13220 Encounter for screening for lipoid disorders: Secondary | ICD-10-CM | POA: Diagnosis not present

## 2022-11-30 DIAGNOSIS — Z23 Encounter for immunization: Secondary | ICD-10-CM | POA: Diagnosis not present

## 2022-11-30 DIAGNOSIS — M25561 Pain in right knee: Secondary | ICD-10-CM | POA: Diagnosis not present

## 2022-11-30 DIAGNOSIS — Z0000 Encounter for general adult medical examination without abnormal findings: Secondary | ICD-10-CM | POA: Diagnosis not present

## 2022-11-30 DIAGNOSIS — F902 Attention-deficit hyperactivity disorder, combined type: Secondary | ICD-10-CM | POA: Diagnosis not present

## 2023-06-06 ENCOUNTER — Encounter: Payer: Self-pay | Admitting: Family Medicine

## 2023-06-06 DIAGNOSIS — Z1231 Encounter for screening mammogram for malignant neoplasm of breast: Secondary | ICD-10-CM

## 2023-06-08 DIAGNOSIS — N951 Menopausal and female climacteric states: Secondary | ICD-10-CM | POA: Diagnosis not present

## 2023-06-08 DIAGNOSIS — F902 Attention-deficit hyperactivity disorder, combined type: Secondary | ICD-10-CM | POA: Diagnosis not present

## 2023-08-11 DIAGNOSIS — N951 Menopausal and female climacteric states: Secondary | ICD-10-CM | POA: Diagnosis not present

## 2023-08-11 DIAGNOSIS — Z1231 Encounter for screening mammogram for malignant neoplasm of breast: Secondary | ICD-10-CM | POA: Diagnosis not present

## 2023-08-11 DIAGNOSIS — R635 Abnormal weight gain: Secondary | ICD-10-CM | POA: Diagnosis not present

## 2023-08-11 DIAGNOSIS — Z124 Encounter for screening for malignant neoplasm of cervix: Secondary | ICD-10-CM | POA: Diagnosis not present

## 2023-08-11 DIAGNOSIS — Z01419 Encounter for gynecological examination (general) (routine) without abnormal findings: Secondary | ICD-10-CM | POA: Diagnosis not present

## 2023-08-11 DIAGNOSIS — Z6829 Body mass index (BMI) 29.0-29.9, adult: Secondary | ICD-10-CM | POA: Diagnosis not present

## 2023-08-23 DIAGNOSIS — H04123 Dry eye syndrome of bilateral lacrimal glands: Secondary | ICD-10-CM | POA: Diagnosis not present

## 2023-08-23 DIAGNOSIS — H02831 Dermatochalasis of right upper eyelid: Secondary | ICD-10-CM | POA: Diagnosis not present

## 2023-08-23 DIAGNOSIS — H02834 Dermatochalasis of left upper eyelid: Secondary | ICD-10-CM | POA: Diagnosis not present

## 2023-09-13 DIAGNOSIS — D239 Other benign neoplasm of skin, unspecified: Secondary | ICD-10-CM | POA: Diagnosis not present

## 2023-09-13 DIAGNOSIS — L720 Epidermal cyst: Secondary | ICD-10-CM | POA: Diagnosis not present

## 2023-09-13 DIAGNOSIS — L814 Other melanin hyperpigmentation: Secondary | ICD-10-CM | POA: Diagnosis not present

## 2023-12-04 DIAGNOSIS — E890 Postprocedural hypothyroidism: Secondary | ICD-10-CM | POA: Diagnosis not present

## 2023-12-04 DIAGNOSIS — F902 Attention-deficit hyperactivity disorder, combined type: Secondary | ICD-10-CM | POA: Diagnosis not present

## 2023-12-04 DIAGNOSIS — E663 Overweight: Secondary | ICD-10-CM | POA: Diagnosis not present

## 2023-12-04 DIAGNOSIS — Z780 Asymptomatic menopausal state: Secondary | ICD-10-CM | POA: Diagnosis not present

## 2023-12-04 DIAGNOSIS — Z0000 Encounter for general adult medical examination without abnormal findings: Secondary | ICD-10-CM | POA: Diagnosis not present

## 2023-12-04 DIAGNOSIS — Z23 Encounter for immunization: Secondary | ICD-10-CM | POA: Diagnosis not present

## 2023-12-05 DIAGNOSIS — Z13220 Encounter for screening for lipoid disorders: Secondary | ICD-10-CM | POA: Diagnosis not present

## 2023-12-05 DIAGNOSIS — Z0000 Encounter for general adult medical examination without abnormal findings: Secondary | ICD-10-CM | POA: Diagnosis not present

## 2023-12-05 DIAGNOSIS — E890 Postprocedural hypothyroidism: Secondary | ICD-10-CM | POA: Diagnosis not present
